# Patient Record
Sex: Male | Born: 1945 | Race: White | Hispanic: No | Marital: Married | Smoking: Former smoker
Health system: Southern US, Community
[De-identification: ages and names within clinical notes are randomized; demographics above are authoritative.]

## PROBLEM LIST (undated history)

## (undated) DIAGNOSIS — F329 Major depressive disorder, single episode, unspecified: Secondary | ICD-10-CM

## (undated) DIAGNOSIS — K635 Polyp of colon: Secondary | ICD-10-CM

## (undated) DIAGNOSIS — F1910 Other psychoactive substance abuse, uncomplicated: Secondary | ICD-10-CM

## (undated) DIAGNOSIS — E785 Hyperlipidemia, unspecified: Secondary | ICD-10-CM

## (undated) DIAGNOSIS — Z8639 Personal history of other endocrine, nutritional and metabolic disease: Secondary | ICD-10-CM

## (undated) DIAGNOSIS — I10 Essential (primary) hypertension: Secondary | ICD-10-CM

## (undated) DIAGNOSIS — K219 Gastro-esophageal reflux disease without esophagitis: Secondary | ICD-10-CM

## (undated) DIAGNOSIS — F32A Depression, unspecified: Secondary | ICD-10-CM

## (undated) DIAGNOSIS — L290 Pruritus ani: Secondary | ICD-10-CM

## (undated) DIAGNOSIS — K589 Irritable bowel syndrome without diarrhea: Secondary | ICD-10-CM

## (undated) HISTORY — DX: Other psychoactive substance abuse, uncomplicated: F19.10

## (undated) HISTORY — PX: NO PAST SURGERIES: SHX2092

## (undated) HISTORY — DX: Polyp of colon: K63.5

## (undated) HISTORY — DX: Depression, unspecified: F32.A

## (undated) HISTORY — DX: Pruritus ani: L29.0

## (undated) HISTORY — DX: Major depressive disorder, single episode, unspecified: F32.9

## (undated) HISTORY — DX: Gilbert syndrome: E80.4

## (undated) HISTORY — DX: Personal history of other endocrine, nutritional and metabolic disease: Z86.39

## (undated) HISTORY — DX: Gastro-esophageal reflux disease without esophagitis: K21.9

## (undated) HISTORY — DX: Irritable bowel syndrome, unspecified: K58.9

## (undated) HISTORY — DX: Hyperlipidemia, unspecified: E78.5

## (undated) HISTORY — DX: Essential (primary) hypertension: I10

---

## 1998-10-06 ENCOUNTER — Ambulatory Visit: Admission: RE | Admit: 1998-10-06 | Discharge: 1998-10-06 | Payer: Self-pay | Admitting: Orthopedic Surgery

## 2002-05-30 ENCOUNTER — Ambulatory Visit (HOSPITAL_COMMUNITY): Admission: RE | Admit: 2002-05-30 | Discharge: 2002-05-30 | Payer: Self-pay | Admitting: *Deleted

## 2002-05-30 ENCOUNTER — Encounter: Payer: Self-pay | Admitting: *Deleted

## 2002-07-02 ENCOUNTER — Emergency Department (HOSPITAL_COMMUNITY): Admission: EM | Admit: 2002-07-02 | Discharge: 2002-07-03 | Payer: Self-pay | Admitting: *Deleted

## 2004-06-12 ENCOUNTER — Ambulatory Visit (HOSPITAL_COMMUNITY): Admission: RE | Admit: 2004-06-12 | Discharge: 2004-06-12 | Payer: Self-pay | Admitting: Family Medicine

## 2008-05-10 ENCOUNTER — Encounter: Admission: RE | Admit: 2008-05-10 | Discharge: 2008-05-10 | Payer: Self-pay | Admitting: Family Medicine

## 2010-02-12 ENCOUNTER — Encounter
Admission: RE | Admit: 2010-02-12 | Discharge: 2010-02-12 | Payer: Self-pay | Source: Home / Self Care | Attending: Family Medicine | Admitting: Family Medicine

## 2010-12-19 ENCOUNTER — Emergency Department (HOSPITAL_COMMUNITY): Payer: Federal, State, Local not specified - PPO

## 2010-12-19 ENCOUNTER — Emergency Department (HOSPITAL_COMMUNITY)
Admission: EM | Admit: 2010-12-19 | Discharge: 2010-12-19 | Discharge status: Against Medical Advice | Payer: Federal, State, Local not specified - PPO | Attending: Emergency Medicine | Admitting: Emergency Medicine

## 2010-12-19 DIAGNOSIS — M549 Dorsalgia, unspecified: Secondary | ICD-10-CM | POA: Insufficient documentation

## 2010-12-19 DIAGNOSIS — R51 Headache: Secondary | ICD-10-CM | POA: Insufficient documentation

## 2010-12-19 DIAGNOSIS — H538 Other visual disturbances: Secondary | ICD-10-CM | POA: Insufficient documentation

## 2010-12-19 LAB — BASIC METABOLIC PANEL
BUN: 22 mg/dL (ref 6–23)
Calcium: 9.3 mg/dL (ref 8.4–10.5)
Chloride: 102 mEq/L (ref 96–112)
Creatinine, Ser: 0.73 mg/dL (ref 0.50–1.35)
GFR calc Af Amer: >90 mL/min (ref >90)
GFR calc non Af Amer: >90 mL/min (ref >90)

## 2010-12-19 LAB — URINALYSIS, ROUTINE W REFLEX MICROSCOPIC
Bilirubin Urine: NEGATIVE
Ketones, ur: NEGATIVE mg/dL
Nitrite: NEGATIVE
pH: 5.5 (ref 5.0–8.0)

## 2010-12-19 LAB — CBC
MCH: 31.8 pg (ref 26.0–34.0)
MCHC: 35.8 g/dL (ref 30.0–36.0)
Platelets: 217 10*3/uL (ref 150–400)
RDW: 12.4 % (ref 11.5–15.5)

## 2010-12-19 LAB — DIFFERENTIAL
Basophils Relative: 0 % (ref 0–1)
Eosinophils Absolute: 0.2 10*3/uL (ref 0.0–0.7)
Eosinophils Relative: 3 % (ref 0–5)
Monocytes Absolute: 0.8 10*3/uL (ref 0.1–1.0)
Monocytes Relative: 12 % (ref 3–12)

## 2010-12-19 LAB — PROTIME-INR: Prothrombin Time: 12.6 seconds (ref 11.6–15.2)

## 2012-02-08 ENCOUNTER — Other Ambulatory Visit: Payer: Self-pay | Admitting: Family Medicine

## 2012-02-08 DIAGNOSIS — M542 Cervicalgia: Secondary | ICD-10-CM

## 2012-02-15 ENCOUNTER — Ambulatory Visit
Admission: RE | Admit: 2012-02-15 | Discharge: 2012-02-15 | Disposition: A | Discharge status: Home / Self Care | Payer: Federal, State, Local not specified - PPO | Source: Ambulatory Visit | Attending: Family Medicine | Admitting: Family Medicine

## 2012-02-15 DIAGNOSIS — M542 Cervicalgia: Secondary | ICD-10-CM

## 2012-05-17 DIAGNOSIS — F331 Major depressive disorder, recurrent, moderate: Secondary | ICD-10-CM | POA: Diagnosis not present

## 2012-05-18 DIAGNOSIS — R21 Rash and other nonspecific skin eruption: Secondary | ICD-10-CM | POA: Diagnosis not present

## 2012-05-31 DIAGNOSIS — F331 Major depressive disorder, recurrent, moderate: Secondary | ICD-10-CM | POA: Diagnosis not present

## 2012-06-14 DIAGNOSIS — I10 Essential (primary) hypertension: Secondary | ICD-10-CM | POA: Diagnosis not present

## 2012-06-14 DIAGNOSIS — E291 Testicular hypofunction: Secondary | ICD-10-CM | POA: Diagnosis not present

## 2012-06-14 DIAGNOSIS — IMO0002 Reserved for concepts with insufficient information to code with codable children: Secondary | ICD-10-CM | POA: Diagnosis not present

## 2012-06-15 DIAGNOSIS — F331 Major depressive disorder, recurrent, moderate: Secondary | ICD-10-CM | POA: Diagnosis not present

## 2012-06-28 DIAGNOSIS — F331 Major depressive disorder, recurrent, moderate: Secondary | ICD-10-CM | POA: Diagnosis not present

## 2012-07-03 DIAGNOSIS — R109 Unspecified abdominal pain: Secondary | ICD-10-CM | POA: Diagnosis not present

## 2012-07-12 DIAGNOSIS — F331 Major depressive disorder, recurrent, moderate: Secondary | ICD-10-CM | POA: Diagnosis not present

## 2012-07-17 DIAGNOSIS — M7989 Other specified soft tissue disorders: Secondary | ICD-10-CM | POA: Diagnosis not present

## 2012-07-19 ENCOUNTER — Ambulatory Visit (HOSPITAL_COMMUNITY): Payer: Federal, State, Local not specified - PPO

## 2012-07-19 DIAGNOSIS — M7989 Other specified soft tissue disorders: Secondary | ICD-10-CM | POA: Diagnosis not present

## 2012-07-26 DIAGNOSIS — F331 Major depressive disorder, recurrent, moderate: Secondary | ICD-10-CM | POA: Diagnosis not present

## 2012-08-08 DIAGNOSIS — F331 Major depressive disorder, recurrent, moderate: Secondary | ICD-10-CM | POA: Diagnosis not present

## 2012-08-23 DIAGNOSIS — F331 Major depressive disorder, recurrent, moderate: Secondary | ICD-10-CM | POA: Diagnosis not present

## 2012-09-06 DIAGNOSIS — F331 Major depressive disorder, recurrent, moderate: Secondary | ICD-10-CM | POA: Diagnosis not present

## 2012-09-20 DIAGNOSIS — F331 Major depressive disorder, recurrent, moderate: Secondary | ICD-10-CM | POA: Diagnosis not present

## 2012-09-26 DIAGNOSIS — R079 Chest pain, unspecified: Secondary | ICD-10-CM | POA: Diagnosis not present

## 2012-09-26 DIAGNOSIS — M2550 Pain in unspecified joint: Secondary | ICD-10-CM | POA: Diagnosis not present

## 2012-09-26 DIAGNOSIS — E291 Testicular hypofunction: Secondary | ICD-10-CM | POA: Diagnosis not present

## 2012-09-26 DIAGNOSIS — Z1211 Encounter for screening for malignant neoplasm of colon: Secondary | ICD-10-CM | POA: Diagnosis not present

## 2012-09-26 DIAGNOSIS — Z0000 Encounter for general adult medical examination without abnormal findings: Secondary | ICD-10-CM | POA: Diagnosis not present

## 2012-09-26 DIAGNOSIS — Z125 Encounter for screening for malignant neoplasm of prostate: Secondary | ICD-10-CM | POA: Diagnosis not present

## 2012-09-26 DIAGNOSIS — Z1159 Encounter for screening for other viral diseases: Secondary | ICD-10-CM | POA: Diagnosis not present

## 2012-09-26 DIAGNOSIS — E785 Hyperlipidemia, unspecified: Secondary | ICD-10-CM | POA: Diagnosis not present

## 2012-09-26 DIAGNOSIS — Z23 Encounter for immunization: Secondary | ICD-10-CM | POA: Diagnosis not present

## 2012-09-26 DIAGNOSIS — I10 Essential (primary) hypertension: Secondary | ICD-10-CM | POA: Diagnosis not present

## 2012-10-04 DIAGNOSIS — F331 Major depressive disorder, recurrent, moderate: Secondary | ICD-10-CM | POA: Diagnosis not present

## 2012-10-11 DIAGNOSIS — E785 Hyperlipidemia, unspecified: Secondary | ICD-10-CM | POA: Diagnosis not present

## 2012-10-13 DIAGNOSIS — Z87891 Personal history of nicotine dependence: Secondary | ICD-10-CM | POA: Diagnosis not present

## 2012-10-18 DIAGNOSIS — F331 Major depressive disorder, recurrent, moderate: Secondary | ICD-10-CM | POA: Diagnosis not present

## 2012-11-08 DIAGNOSIS — F331 Major depressive disorder, recurrent, moderate: Secondary | ICD-10-CM | POA: Diagnosis not present

## 2012-11-22 DIAGNOSIS — F331 Major depressive disorder, recurrent, moderate: Secondary | ICD-10-CM | POA: Diagnosis not present

## 2012-11-29 DIAGNOSIS — Z23 Encounter for immunization: Secondary | ICD-10-CM | POA: Diagnosis not present

## 2012-12-13 DIAGNOSIS — F331 Major depressive disorder, recurrent, moderate: Secondary | ICD-10-CM | POA: Diagnosis not present

## 2012-12-27 DIAGNOSIS — F331 Major depressive disorder, recurrent, moderate: Secondary | ICD-10-CM | POA: Diagnosis not present

## 2013-01-10 DIAGNOSIS — F331 Major depressive disorder, recurrent, moderate: Secondary | ICD-10-CM | POA: Diagnosis not present

## 2013-01-10 DIAGNOSIS — I872 Venous insufficiency (chronic) (peripheral): Secondary | ICD-10-CM | POA: Diagnosis not present

## 2013-01-24 DIAGNOSIS — L821 Other seborrheic keratosis: Secondary | ICD-10-CM | POA: Diagnosis not present

## 2013-01-24 DIAGNOSIS — A630 Anogenital (venereal) warts: Secondary | ICD-10-CM | POA: Diagnosis not present

## 2013-01-24 DIAGNOSIS — D485 Neoplasm of uncertain behavior of skin: Secondary | ICD-10-CM | POA: Diagnosis not present

## 2013-01-24 DIAGNOSIS — L719 Rosacea, unspecified: Secondary | ICD-10-CM | POA: Diagnosis not present

## 2013-01-24 DIAGNOSIS — D239 Other benign neoplasm of skin, unspecified: Secondary | ICD-10-CM | POA: Diagnosis not present

## 2013-01-24 DIAGNOSIS — F331 Major depressive disorder, recurrent, moderate: Secondary | ICD-10-CM | POA: Diagnosis not present

## 2013-02-06 DIAGNOSIS — F331 Major depressive disorder, recurrent, moderate: Secondary | ICD-10-CM | POA: Diagnosis not present

## 2013-02-21 DIAGNOSIS — F331 Major depressive disorder, recurrent, moderate: Secondary | ICD-10-CM | POA: Diagnosis not present

## 2013-03-14 DIAGNOSIS — F331 Major depressive disorder, recurrent, moderate: Secondary | ICD-10-CM | POA: Diagnosis not present

## 2013-03-16 ENCOUNTER — Other Ambulatory Visit: Payer: Self-pay | Admitting: Family Medicine

## 2013-03-16 ENCOUNTER — Ambulatory Visit
Admission: RE | Admit: 2013-03-16 | Discharge: 2013-03-16 | Disposition: A | Discharge status: Home / Self Care | Payer: Medicare Other | Source: Ambulatory Visit | Attending: Family Medicine | Admitting: Family Medicine

## 2013-03-16 DIAGNOSIS — R109 Unspecified abdominal pain: Secondary | ICD-10-CM

## 2013-03-16 DIAGNOSIS — K5730 Diverticulosis of large intestine without perforation or abscess without bleeding: Secondary | ICD-10-CM | POA: Diagnosis not present

## 2013-03-16 DIAGNOSIS — J069 Acute upper respiratory infection, unspecified: Secondary | ICD-10-CM | POA: Diagnosis not present

## 2013-03-16 DIAGNOSIS — E291 Testicular hypofunction: Secondary | ICD-10-CM | POA: Diagnosis not present

## 2013-03-16 DIAGNOSIS — M549 Dorsalgia, unspecified: Secondary | ICD-10-CM | POA: Diagnosis not present

## 2013-03-16 MED ORDER — IOHEXOL 300 MG/ML  SOLN
100.0000 mL | Freq: Once | INTRAMUSCULAR | Status: AC | PRN
  Administered 2013-03-16: 100 mL via INTRAVENOUS

## 2013-03-16 MED ORDER — IOHEXOL 300 MG/ML  SOLN
30.0000 mL | Freq: Once | INTRAMUSCULAR | Status: AC | PRN
  Administered 2013-03-16: 30 mL via ORAL

## 2013-03-28 DIAGNOSIS — F331 Major depressive disorder, recurrent, moderate: Secondary | ICD-10-CM | POA: Diagnosis not present

## 2013-04-11 DIAGNOSIS — F331 Major depressive disorder, recurrent, moderate: Secondary | ICD-10-CM | POA: Diagnosis not present

## 2013-04-25 DIAGNOSIS — F331 Major depressive disorder, recurrent, moderate: Secondary | ICD-10-CM | POA: Diagnosis not present

## 2013-05-09 DIAGNOSIS — F331 Major depressive disorder, recurrent, moderate: Secondary | ICD-10-CM | POA: Diagnosis not present

## 2013-05-23 DIAGNOSIS — F331 Major depressive disorder, recurrent, moderate: Secondary | ICD-10-CM | POA: Diagnosis not present

## 2013-06-04 DIAGNOSIS — R5381 Other malaise: Secondary | ICD-10-CM | POA: Diagnosis not present

## 2013-06-04 DIAGNOSIS — I10 Essential (primary) hypertension: Secondary | ICD-10-CM | POA: Diagnosis not present

## 2013-06-04 DIAGNOSIS — R079 Chest pain, unspecified: Secondary | ICD-10-CM | POA: Diagnosis not present

## 2013-06-04 DIAGNOSIS — E291 Testicular hypofunction: Secondary | ICD-10-CM | POA: Diagnosis not present

## 2013-06-05 ENCOUNTER — Ambulatory Visit
Admission: RE | Admit: 2013-06-05 | Discharge: 2013-06-05 | Disposition: A | Discharge status: Home / Self Care | Payer: Medicare Other | Source: Ambulatory Visit | Attending: Family Medicine | Admitting: Family Medicine

## 2013-06-05 ENCOUNTER — Other Ambulatory Visit: Payer: Self-pay | Admitting: Family Medicine

## 2013-06-05 DIAGNOSIS — R079 Chest pain, unspecified: Secondary | ICD-10-CM

## 2013-06-06 DIAGNOSIS — F331 Major depressive disorder, recurrent, moderate: Secondary | ICD-10-CM | POA: Diagnosis not present

## 2013-06-20 DIAGNOSIS — F331 Major depressive disorder, recurrent, moderate: Secondary | ICD-10-CM | POA: Diagnosis not present

## 2013-07-03 ENCOUNTER — Encounter: Payer: Self-pay | Admitting: Cardiology

## 2013-07-03 ENCOUNTER — Ambulatory Visit (INDEPENDENT_AMBULATORY_CARE_PROVIDER_SITE_OTHER): Payer: Medicare Other | Admitting: Cardiology

## 2013-07-03 VITALS — BP 108/76 | HR 48 | Ht 71.0 in | Wt 206.0 lb

## 2013-07-03 DIAGNOSIS — I10 Essential (primary) hypertension: Secondary | ICD-10-CM | POA: Insufficient documentation

## 2013-07-03 DIAGNOSIS — R5383 Other fatigue: Secondary | ICD-10-CM | POA: Diagnosis not present

## 2013-07-03 DIAGNOSIS — R0600 Dyspnea, unspecified: Secondary | ICD-10-CM

## 2013-07-03 DIAGNOSIS — R5381 Other malaise: Secondary | ICD-10-CM

## 2013-07-03 DIAGNOSIS — E785 Hyperlipidemia, unspecified: Secondary | ICD-10-CM | POA: Diagnosis not present

## 2013-07-03 DIAGNOSIS — R0609 Other forms of dyspnea: Secondary | ICD-10-CM

## 2013-07-03 DIAGNOSIS — R0789 Other chest pain: Secondary | ICD-10-CM | POA: Insufficient documentation

## 2013-07-03 DIAGNOSIS — R0989 Other specified symptoms and signs involving the circulatory and respiratory systems: Secondary | ICD-10-CM | POA: Diagnosis not present

## 2013-07-03 DIAGNOSIS — E291 Testicular hypofunction: Secondary | ICD-10-CM | POA: Insufficient documentation

## 2013-07-03 NOTE — Patient Instructions (Signed)
Your physician has requested that you have en exercise stress myoview. For further information please visit [email or web address]. Please follow instruction sheet, as given.  Your physician recommends that you schedule a follow-up appointment with Dr. Meda Coffee after testing.

## 2013-07-03 NOTE — Progress Notes (Signed)
Patient ID: SHAMIR TUZZOLINO, male   DOB: [birth year], 68 y.o.   MRN: [id number]    Patient Name: Dustin Mckay Date of Encounter: 07/03/2013  Primary Care Provider:  No primary provider on file. Primary Cardiologist:  Dorothy Spark  Problem List   Past Medical History  Diagnosis Date  . Hypertension   . Hyperlipidemia   . H/O hypogonadism   . Esophageal reflux   . Gilbert's syndrome   . IBS (irritable bowel syndrome)   . Depression   . 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 Hyperplastic colon polyp   . Pruritus ani     Dr. Crista Luria in past consultation    Allergies  Allergies  Allergen Reactions  . Codeine Nausea And Vomiting  . Mobic [Meloxicam] Nausea Only  . Paxil [Paroxetine Hcl]     Empty-headed  . Zoloft [Sertraline Hcl]     Headache  . 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 The patient has no significant past medical history other than controlled hypertension and hypogonadism who is coming with concern of the peak exertional dyspnea associated with chest pressure. He usually used to go to the gym 3 times a week and was completely asymptomatic however recently he has been under significant amount of stress at work became more inactive andto be short of breath on activities but used to be no problem for him. He denies any palpitations or syncope. No lower extremity edema, paroxysmal nocturnal dyspnea or orthopnea. He doesn't have significant family history of coronary artery disease. He drinks alcohol occasionally and has never smoked. He states that he ran out off testosterone supplements recently and he believes that his lack of energy has been as a consequence of it. Since he restarted his energy has improved but is not back to baseline.   Home Medications  Prior to Admission medications   Medication Sig Start Date End Date Taking?  Authorizing Provider  ANDROGEL PUMP 20.25 MG/ACT (1.62%) GEL  06/13/13  Yes Historical Provider, MD  bisoprolol (ZEBETA) 5 MG tablet Take 2.5 mg by mouth daily.  06/12/13  Yes Historical Provider, MD  metroNIDAZOLE (METROGEL) 1 % gel Apply 1 application topically daily.  05/11/13  Yes Historical Provider, MD    Family History  Family History  Problem Relation Age of Onset  . Other Mother     respiratory problems   . Alcoholism Mother   . Gout Father   . Cirrhosis Father   . Hypertension Father   . Other Father     liposarcoma  . Pancreatic cancer Maternal Grandfather    Social History  History   Social History  . Marital Status: Married    Spouse Name: N/A    Number of Children: N/A  . Years of Education: N/A   Occupational History  . Not on file.   Social History Main Topics  . Smoking status: Former Research scientist (life sciences)  . Smokeless tobacco: Not on file  . Alcohol Use: Yes  . Drug Use: No  . Sexual Activity: Not on file   Other Topics Concern  . Not on file   Social History Narrative  . No narrative on file     Review of Systems, as per HPI, otherwise negative General:  No chills, fever, night sweats or weight changes.  Cardiovascular:  No chest pain, dyspnea on exertion, edema, orthopnea, palpitations,  paroxysmal nocturnal dyspnea. Dermatological: No rash, lesions/masses Respiratory: No cough, dyspnea Urologic: No hematuria, dysuria Abdominal:   No nausea, vomiting, diarrhea, bright red blood per rectum, melena, or hematemesis Neurologic:  No visual changes, wkns, changes in mental status. All other systems reviewed and are otherwise negative except as noted above.  Physical Exam  Blood pressure 108/76, pulse 48, height 5\' 11"  (1.803 m), weight 206 lb (93.441 kg).  General: Pleasant, NAD Psych: Normal affect. Neuro: Alert and oriented X 3. Moves all extremities spontaneously. HEENT: Normal  Neck: Supple without bruits or JVD. Lungs:  Resp regular and unlabored,  CTA. Heart: RRR no s3, s4, or murmurs. Abdomen: Soft, non-tender, non-distended, BS + x 4.  Extremities: No clubbing, cyanosis or edema. DP/PT/Radials 2+ and equal bilaterally.  Labs:  No results found for this basename: CKTOTAL, CKMB, TROPONINI,  in the last 72 hours Lab Results  Component Value Date   WBC 7.2 12/19/2010   HGB 14.7 12/19/2010   HCT 41.1 12/19/2010   MCV 89.0 12/19/2010   PLT 217 12/19/2010    No results found for this basename: DDIMER   No components found with this basename: POCBNP,     Component Value Date/Time   NA 137 12/19/2010 1230   K 4.0 12/19/2010 1230   CL 102 12/19/2010 1230   CO2 25 12/19/2010 1230   GLUCOSE 93 12/19/2010 1230   BUN 22 12/19/2010 1230   CREATININE 0.73 12/19/2010 1230   CALCIUM 9.3 12/19/2010 1230   GFRNONAA >90 12/19/2010 1230   GFRAA >90 12/19/2010 1230   No results found for this basename: CHOL, HDL, LDLCALC, TRIG    Accessory Clinical Findings  Echocardiogram - none  ECG - Sinus bradycardia, normal ECG    Assessment & Plan  68 year old male with h/o HTN  1. DOE with chest tightness and fatigue - we will order an exercise nuclear stress test   2. HTN  - controlled   3. Hyperlipidemia - LDL 128, HDL 45, TAG 163. There are ways to improve his diet. Also he is advised to use OTC fish oil and red yeast rice.   Follow up in 2 months  Dorothy Spark, MD, [facility] 07/03/2013, 8:34 AM

## 2013-07-04 DIAGNOSIS — F331 Major depressive disorder, recurrent, moderate: Secondary | ICD-10-CM | POA: Diagnosis not present

## 2013-07-17 ENCOUNTER — Ambulatory Visit (HOSPITAL_COMMUNITY): Payer: Medicare Other | Attending: Cardiovascular Disease | Admitting: Radiology

## 2013-07-17 VITALS — BP 127/87 | HR 41 | Ht 71.0 in | Wt 201.0 lb

## 2013-07-17 DIAGNOSIS — R079 Chest pain, unspecified: Secondary | ICD-10-CM | POA: Diagnosis not present

## 2013-07-17 DIAGNOSIS — R0789 Other chest pain: Secondary | ICD-10-CM

## 2013-07-17 DIAGNOSIS — R0602 Shortness of breath: Secondary | ICD-10-CM

## 2013-07-17 DIAGNOSIS — R5381 Other malaise: Secondary | ICD-10-CM | POA: Diagnosis not present

## 2013-07-17 DIAGNOSIS — R5383 Other fatigue: Secondary | ICD-10-CM

## 2013-07-17 MED ORDER — TECHNETIUM TC 99M SESTAMIBI GENERIC - CARDIOLITE
10.0000 | Freq: Once | INTRAVENOUS | Status: AC | PRN
  Administered 2013-07-17: 10 via INTRAVENOUS

## 2013-07-17 MED ORDER — TECHNETIUM TC 99M SESTAMIBI GENERIC - CARDIOLITE
30.0000 | Freq: Once | INTRAVENOUS | Status: AC | PRN
  Administered 2013-07-17: 30 via INTRAVENOUS

## 2013-07-17 NOTE — Progress Notes (Signed)
[place] [place] [address] [place], [place] [address] [phone]    Cardiology Nuclear Med Study  Dustin Mckay is a 68 y.o. male     MRN : [id number]     DOB: [birth year]  Procedure Date: 07/17/2013  Nuclear Med Background Indication for Stress Test:  Evaluation for Ischemia History: No prior known history of CAD, and 02-21-2004: Myocardial Perfusion Imaging-Normal, EF=64% Cardiac Risk Factors: History of Smoking, Hypertension and Lipids  Symptoms: Chest pain with/without exertion (last occurrence 1 to 2 weeks ago), Dizziness and DOE   Nuclear Pre-Procedure Caffeine/Decaff Intake:  None > 12 hrs NPO After: 7:30pm   Lungs:  clear O2 Sat: 95% on room air. IV 0.9% NS with Angio Cath:  22g  IV Site: R Hand x 1, tolerated well IV Started by:  Irven Baltimore, RN  Chest Size (in):  44L Cup Size: n/a  Height: 5\' 11"  (1.803 m)  Weight:  201 lb (91.173 kg)  BMI:  Body mass index is 28.05 kg/(m^2). Tech Comments:  Patient held Zebeta x 24 hrs. Irven Baltimore, RN    Nuclear Med Study 1 or 2 day study: 1 day  Stress Test Type:  Stress  Reading MD: N/A  Order Authorizing Provider:  Ena Dawley, MD  Resting Radionuclide: Technetium 83m Sestamibi  Resting Radionuclide Dose: 11.0 mCi   Stress Radionuclide:  Technetium 37m Sestamibi  Stress Radionuclide Dose: 33.0 mCi           Stress Protocol Rest HR: 41 Stress HR: 136  Rest BP: 127/87 Stress BP: 168/72  Exercise Time (min): 11:30 METS: 13.4   Predicted Max HR: 154 bpm % Max HR: 88.31 bpm Rate Pressure Product: 51884   Dose of Adenosine (mg):  n/a Dose of Lexiscan: n/a mg  Dose of Atropine (mg): n/a Dose of Dobutamine: n/a mcg/kg/min (at max HR)  Stress Test Technologist: Irven Baltimore, RN  Nuclear Technologist:  Vedia Pereyra, CNMT     Rest Procedure:  Myocardial perfusion imaging was performed at rest 45 minutes following the intravenous administration of Technetium 71m Sestamibi. Rest ECG:  Sinus bradycardia rate 41 with no other abnormalities.  Stress Procedure:  The patient exercised on the treadmill utilizing the Bruce Protocol for 11:30 minutes, RPE=17. The patient stopped due to DOE, Fatigue, and denied any chest pain. There was a blunted BP response to exercise, off Zebeta x 24 hrs. Technetium 43m Sestamibi was injected at peak exercise and myocardial perfusion imaging was performed after a brief delay. Stress ECG: During exercise, PACs noted, nonspecific ST-T wave changes.  QPS Raw Data Images:  Both arms are in down position. Stress Images:  There is subtle decreased uptake in the basal inferolateral wall. Otherwise, homogeneous radiotracer uptake. Rest Images:  There is decrease uptake in the inferolateral wall with bowel loop attenuation noted. No significant change from stress. Subtraction (SDS):  No evidence of ischemia. Transient Ischemic Dilatation (Normal <1.22):  **0.89* Lung/Heart Ratio (Normal <0.45):  0.31  Quantitative Gated Spect Images QGS EDV:  106 ml QGS ESV:  45 ml  Impression Exercise Capacity:  Excellent exercise capacity. BP Response:  Normal blood pressure response. Clinical Symptoms:  Mild dyspnea on exertion/fatigue. ECG Impression:  Nonspecific ST-T wave changes noted during stress. Comparison with Prior Nuclear Study: No images to compare  Overall Impression:  Low risk stress nuclear study With no areas of ischemia identified..  LV Ejection Fraction: 58%.  LV Wall Motion:  NL LV Function; NL Wall Motion,  PACs noted during stress.  Candee Furbish, MD

## 2013-07-18 ENCOUNTER — Encounter: Payer: Self-pay | Admitting: Cardiology

## 2013-07-18 ENCOUNTER — Ambulatory Visit (INDEPENDENT_AMBULATORY_CARE_PROVIDER_SITE_OTHER): Payer: Medicare Other | Admitting: Cardiology

## 2013-07-18 VITALS — BP 133/79 | HR 49 | Ht 71.0 in | Wt 205.0 lb

## 2013-07-18 DIAGNOSIS — R5383 Other fatigue: Secondary | ICD-10-CM | POA: Diagnosis not present

## 2013-07-18 DIAGNOSIS — I10 Essential (primary) hypertension: Secondary | ICD-10-CM | POA: Diagnosis not present

## 2013-07-18 DIAGNOSIS — R5381 Other malaise: Secondary | ICD-10-CM

## 2013-07-18 DIAGNOSIS — R0609 Other forms of dyspnea: Secondary | ICD-10-CM

## 2013-07-18 DIAGNOSIS — E291 Testicular hypofunction: Secondary | ICD-10-CM

## 2013-07-18 DIAGNOSIS — R0989 Other specified symptoms and signs involving the circulatory and respiratory systems: Secondary | ICD-10-CM

## 2013-07-18 DIAGNOSIS — F331 Major depressive disorder, recurrent, moderate: Secondary | ICD-10-CM | POA: Diagnosis not present

## 2013-07-18 DIAGNOSIS — R0789 Other chest pain: Secondary | ICD-10-CM

## 2013-07-18 NOTE — Progress Notes (Signed)
Patient ID: Dustin Mckay, male   DOB: [birth year], 68 y.o.   MRN: [id number] Patient ID: Dustin Mckay, male   DOB: 03-11-1945, 68 y.o.   MRN: [id number]    Patient Name: Dustin Mckay Date of Encounter: 07/18/2013  Primary Care Provider:  No primary provider on file. Primary Cardiologist:  Dorothy Spark  Problem List   Past Medical History  Diagnosis Date  . Hypertension   . Hyperlipidemia   . H/O hypogonadism   . Esophageal reflux   . Gilbert's syndrome   . IBS (irritable bowel syndrome)   . Depression   . 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 Hyperplastic colon polyp   . Pruritus ani     Dr. Crista Luria in past consultation    Allergies  Allergies  Allergen Reactions  . Codeine Nausea And Vomiting  . Mobic [Meloxicam] Nausea Only  . Paxil [Paroxetine Hcl]     Empty-headed  . Zoloft [Sertraline Hcl]     Headache  . 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 The patient has no significant past medical history other than controlled hypertension and hypogonadism who is coming with concern of the peak exertional dyspnea associated with chest pressure. He usually used to go to the gym 3 times a week and was completely asymptomatic however recently he has been under significant amount of stress at work became more inactive andto be short of breath on activities but used to be no problem for him. He denies any palpitations or syncope. No lower extremity edema, paroxysmal nocturnal dyspnea or orthopnea. He doesn't have significant family history of coronary artery disease. He drinks alcohol occasionally and has never smoked. He states that he ran out off testosterone supplements recently and he believes that his lack of energy has been as a consequence of it. Since he restarted his energy has improved but is not back to baseline.   The patient is coming  after 2 weeks for the test results. No more episodes of chest pain. He feels very stressed with his job at [employer] and doesn't sleep much.  Home Medications  Prior to Admission medications   Medication Sig Start Date End Date Taking? Authorizing Provider  ANDROGEL PUMP 20.25 MG/ACT (1.62%) GEL  06/13/13  Yes Historical Provider, MD  bisoprolol (ZEBETA) 5 MG tablet Take 2.5 mg by mouth daily.  06/12/13  Yes Historical Provider, MD  metroNIDAZOLE (METROGEL) 1 % gel Apply 1 application topically daily.  05/11/13  Yes Historical Provider, MD    Family History  Family History  Problem Relation Age of Onset  . Other Mother     respiratory problems   . Alcoholism Mother   . Gout Father   . Cirrhosis Father   . Hypertension Father   . Other Father     liposarcoma  . Pancreatic cancer Maternal Grandfather    Social History  History   Social History  . Marital Status: Married    Spouse Name: N/A    Number of Children: N/A  . Years of Education: N/A   Occupational History  . Not on file.   Social History Main Topics  . Smoking status: Former Research scientist (life sciences)  . Smokeless tobacco: Not on file  . Alcohol Use: Yes  . Drug Use: No  . Sexual Activity: Not on file   Other Topics Concern  .  Not on file   Social History Narrative  . No narrative on file     Review of Systems, as per HPI, otherwise negative General:  No chills, fever, night sweats or weight changes.  Cardiovascular:  No chest pain, dyspnea on exertion, edema, orthopnea, palpitations, paroxysmal nocturnal dyspnea. Dermatological: No rash, lesions/masses Respiratory: No cough, dyspnea Urologic: No hematuria, dysuria Abdominal:   No nausea, vomiting, diarrhea, bright red blood per rectum, melena, or hematemesis Neurologic:  No visual changes, wkns, changes in mental status. All other systems reviewed and are otherwise negative except as noted above.  Physical Exam  Blood pressure 133/79, pulse 49, height 5\' 11"  (1.803  m), weight 205 lb (92.987 kg).  General: Pleasant, NAD Psych: Normal affect. Neuro: Alert and oriented X 3. Moves all extremities spontaneously. HEENT: Normal  Neck: Supple without bruits or JVD. Lungs:  Resp regular and unlabored, CTA. Heart: RRR no s3, s4, or murmurs. Abdomen: Soft, non-tender, non-distended, BS + x 4.  Extremities: No clubbing, cyanosis or edema. DP/PT/Radials 2+ and equal bilaterally.  Labs:  No results found for this basename: CKTOTAL, CKMB, TROPONINI,  in the last 72 hours Lab Results  Component Value Date   WBC 7.2 12/19/2010   HGB 14.7 12/19/2010   HCT 41.1 12/19/2010   MCV 89.0 12/19/2010   PLT 217 12/19/2010    No results found for this basename: DDIMER   No components found with this basename: POCBNP,     Component Value Date/Time   NA 137 12/19/2010 1230   K 4.0 12/19/2010 1230   CL 102 12/19/2010 1230   CO2 25 12/19/2010 1230   GLUCOSE 93 12/19/2010 1230   BUN 22 12/19/2010 1230   CREATININE 0.73 12/19/2010 1230   CALCIUM 9.3 12/19/2010 1230   GFRNONAA >90 12/19/2010 1230   GFRAA >90 12/19/2010 1230   No results found for this basename: CHOL,  HDL,  LDLCALC,  TRIG    Accessory Clinical Findings  Echocardiogram - none  ECG - Sinus bradycardia, normal ECG    Assessment & Plan  68 year old male with h/o HTN  1. DOE with chest tightness and fatigue -  an exercise nuclear stress test showed excellent exercise capacity, 13 METS, no ECG changes, no scar or ischemia, LVEF 58%.  2. HTN  - controlled   3. Hyperlipidemia - LDL 128, HDL 45, TAG 163. There are ways to improve his diet. Also he is advised to use OTC fish oil and red yeast rice. Discussed the results again, diet in details, importance of exercise  Follow up in 4 months with repeat CMP and lipid profile prior to the visit.  Dorothy Spark, MD, [facility] 07/18/2013, 9:36 AM

## 2013-07-18 NOTE — Patient Instructions (Addendum)
Your physician recommends that you continue on your current medications as directed. Please refer to the Current Medication list given to you today.  Your physician recommends that you return for lab work in: IN September [place] ([facility] LIPID PROFILE) 11-06-13 IS APPOINTMENT  Your physician wants you to follow-up in: September WITH DR NELSON (COME IN FOR LAB WORK PRIOR TOO) You will receive a reminder letter in the mail two months in advance. If you don't receive a letter, please call our office to schedule the follow-up appointment.

## 2013-08-01 DIAGNOSIS — F331 Major depressive disorder, recurrent, moderate: Secondary | ICD-10-CM | POA: Diagnosis not present

## 2013-08-02 DIAGNOSIS — R300 Dysuria: Secondary | ICD-10-CM | POA: Diagnosis not present

## 2013-08-02 DIAGNOSIS — D126 Benign neoplasm of colon, unspecified: Secondary | ICD-10-CM | POA: Diagnosis not present

## 2013-08-15 DIAGNOSIS — F331 Major depressive disorder, recurrent, moderate: Secondary | ICD-10-CM | POA: Diagnosis not present

## 2013-08-29 DIAGNOSIS — F331 Major depressive disorder, recurrent, moderate: Secondary | ICD-10-CM | POA: Diagnosis not present

## 2013-09-11 DIAGNOSIS — A630 Anogenital (venereal) warts: Secondary | ICD-10-CM | POA: Diagnosis not present

## 2013-09-11 DIAGNOSIS — R300 Dysuria: Secondary | ICD-10-CM | POA: Diagnosis not present

## 2013-09-12 DIAGNOSIS — F331 Major depressive disorder, recurrent, moderate: Secondary | ICD-10-CM | POA: Diagnosis not present

## 2013-09-13 DIAGNOSIS — Z1211 Encounter for screening for malignant neoplasm of colon: Secondary | ICD-10-CM | POA: Diagnosis not present

## 2013-09-13 DIAGNOSIS — K5730 Diverticulosis of large intestine without perforation or abscess without bleeding: Secondary | ICD-10-CM | POA: Diagnosis not present

## 2013-10-10 DIAGNOSIS — F331 Major depressive disorder, recurrent, moderate: Secondary | ICD-10-CM | POA: Diagnosis not present

## 2013-10-19 DIAGNOSIS — E291 Testicular hypofunction: Secondary | ICD-10-CM | POA: Diagnosis not present

## 2013-10-19 DIAGNOSIS — Z0000 Encounter for general adult medical examination without abnormal findings: Secondary | ICD-10-CM | POA: Diagnosis not present

## 2013-10-19 DIAGNOSIS — I10 Essential (primary) hypertension: Secondary | ICD-10-CM | POA: Diagnosis not present

## 2013-10-19 DIAGNOSIS — E785 Hyperlipidemia, unspecified: Secondary | ICD-10-CM | POA: Diagnosis not present

## 2013-10-19 DIAGNOSIS — Z125 Encounter for screening for malignant neoplasm of prostate: Secondary | ICD-10-CM | POA: Diagnosis not present

## 2013-10-24 DIAGNOSIS — F331 Major depressive disorder, recurrent, moderate: Secondary | ICD-10-CM | POA: Diagnosis not present

## 2013-10-29 DIAGNOSIS — L259 Unspecified contact dermatitis, unspecified cause: Secondary | ICD-10-CM | POA: Diagnosis not present

## 2013-10-29 DIAGNOSIS — D239 Other benign neoplasm of skin, unspecified: Secondary | ICD-10-CM | POA: Diagnosis not present

## 2013-10-29 DIAGNOSIS — L821 Other seborrheic keratosis: Secondary | ICD-10-CM | POA: Diagnosis not present

## 2013-10-29 DIAGNOSIS — B353 Tinea pedis: Secondary | ICD-10-CM | POA: Diagnosis not present

## 2013-10-29 DIAGNOSIS — D1801 Hemangioma of skin and subcutaneous tissue: Secondary | ICD-10-CM | POA: Diagnosis not present

## 2013-10-29 DIAGNOSIS — L719 Rosacea, unspecified: Secondary | ICD-10-CM | POA: Diagnosis not present

## 2013-11-02 DIAGNOSIS — E785 Hyperlipidemia, unspecified: Secondary | ICD-10-CM | POA: Diagnosis not present

## 2013-11-06 ENCOUNTER — Other Ambulatory Visit: Payer: Medicare Other

## 2013-11-07 DIAGNOSIS — F331 Major depressive disorder, recurrent, moderate: Secondary | ICD-10-CM | POA: Diagnosis not present

## 2013-11-21 DIAGNOSIS — F331 Major depressive disorder, recurrent, moderate: Secondary | ICD-10-CM | POA: Diagnosis not present

## 2013-12-05 DIAGNOSIS — F331 Major depressive disorder, recurrent, moderate: Secondary | ICD-10-CM | POA: Diagnosis not present

## 2013-12-19 DIAGNOSIS — F331 Major depressive disorder, recurrent, moderate: Secondary | ICD-10-CM | POA: Diagnosis not present

## 2014-01-03 DIAGNOSIS — F331 Major depressive disorder, recurrent, moderate: Secondary | ICD-10-CM | POA: Diagnosis not present

## 2014-01-16 DIAGNOSIS — F331 Major depressive disorder, recurrent, moderate: Secondary | ICD-10-CM | POA: Diagnosis not present

## 2014-01-21 DIAGNOSIS — Z23 Encounter for immunization: Secondary | ICD-10-CM | POA: Diagnosis not present

## 2014-01-30 DIAGNOSIS — F331 Major depressive disorder, recurrent, moderate: Secondary | ICD-10-CM | POA: Diagnosis not present

## 2014-02-11 DIAGNOSIS — R0789 Other chest pain: Secondary | ICD-10-CM | POA: Diagnosis not present

## 2014-02-13 DIAGNOSIS — F331 Major depressive disorder, recurrent, moderate: Secondary | ICD-10-CM | POA: Diagnosis not present

## 2014-02-19 ENCOUNTER — Encounter: Payer: Self-pay | Admitting: Cardiology

## 2014-02-19 ENCOUNTER — Other Ambulatory Visit: Payer: Self-pay | Admitting: *Deleted

## 2014-02-19 ENCOUNTER — Ambulatory Visit (INDEPENDENT_AMBULATORY_CARE_PROVIDER_SITE_OTHER): Payer: Medicare Other | Admitting: Cardiology

## 2014-02-19 VITALS — BP 122/90 | HR 51 | Ht 71.0 in | Wt 211.0 lb

## 2014-02-19 DIAGNOSIS — R0789 Other chest pain: Secondary | ICD-10-CM

## 2014-02-19 DIAGNOSIS — Z8249 Family history of ischemic heart disease and other diseases of the circulatory system: Secondary | ICD-10-CM

## 2014-02-19 DIAGNOSIS — R079 Chest pain, unspecified: Secondary | ICD-10-CM | POA: Diagnosis not present

## 2014-02-19 DIAGNOSIS — E785 Hyperlipidemia, unspecified: Secondary | ICD-10-CM

## 2014-02-19 NOTE — Patient Instructions (Signed)
Your physician recommends that you continue on your current medications as directed. Please refer to the Current Medication list given to you today. Your physician recommends that you return for lab work in: March ([place] ---PLEASE BE FASTING) Your physician wants you to follow-up in: 4 months with Dr. Meda Coffee.  You will receive a reminder letter in the mail two months in advance. If you don't receive a letter, please call our office to schedule the follow-up appointment.  Please call to schedule cardiac CT score for in January. There is a $150 fee for this test.

## 2014-02-19 NOTE — Progress Notes (Signed)
Patient ID: Dustin Mckay, male   DOB: 1945/12/07, 68 y.o.   MRN: [id number]    Patient Name: Dustin Mckay Date of Encounter: 02/19/2014  Primary Care Provider:  No primary care provider on file. Primary Cardiologist:  Dorothy Spark  Problem List   Past Medical History  Diagnosis Date  . Hypertension   . Hyperlipidemia   . H/O hypogonadism   . Esophageal reflux   . Gilbert's syndrome   . IBS (irritable bowel syndrome)   . Depression   . 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 Hyperplastic colon polyp   . Pruritus ani     Dr. Crista Luria in past consultation    Allergies  Allergies  Allergen Reactions  . Codeine Nausea And Vomiting  . Mobic [Meloxicam] Nausea Only  . Paxil [Paroxetine Hcl]     Empty-headed  . Zoloft [Sertraline Hcl]     Headache  . 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 The patient has no significant past medical history other than controlled hypertension and hypogonadism who is coming with concern of the peak exertional dyspnea associated with chest pressure. He usually used to go to the gym 3 times a week and was completely asymptomatic however recently he has been under significant amount of stress at work became more inactive and to be short of breath on activities but used to be no problem for him. He denies any palpitations or syncope. No lower extremity edema, paroxysmal nocturnal dyspnea or orthopnea. He doesn't have significant family history of coronary artery disease. He drinks alcohol occasionally and has never smoked. He states that he ran out off testosterone supplements recently and he believes that his lack of energy has been as a consequence of it. Since he restarted his energy has improved but is not back to baseline.   The patient is coming after 2 weeks for the test results. No more episodes of chest pain. He  feels very stressed with his job at [employer] and doesn't sleep much.  02/19/2014 - the patient is coming after 6 months. In the meantime he was doing okay and trying to speak healthy diet and exercise. 2 weeks ago he went to the ER because he developed musculoskeletal pain in his chest. He feels no exertional pain when he goes to the gym but feel pains afterward that was relieved by Aleve. He hasn't been taking red yeast rice and is asking about [place] and other types of diet.  Home Medications  Prior to Admission medications   Medication Sig Start Date End Date Taking? Authorizing Provider  ANDROGEL PUMP 20.25 MG/ACT (1.62%) GEL  06/13/13  Yes Historical Provider, MD  bisoprolol (ZEBETA) 5 MG tablet Take 2.5 mg by mouth daily.  06/12/13  Yes Historical Provider, MD  metroNIDAZOLE (METROGEL) 1 % gel Apply 1 application topically daily.  05/11/13  Yes Historical Provider, MD    Family History  Family History  Problem Relation Age of Onset  . Other Mother     respiratory problems   . Alcoholism Mother   . Gout Father   . Cirrhosis Father   . Hypertension Father   . Other Father     liposarcoma  . Pancreatic cancer Maternal Grandfather    Social History  History   Social History  . Marital Status: Married    Spouse Name: N/A  Number of Children: N/A  . Years of Education: N/A   Occupational History  . Not on file.   Social History Main Topics  . Smoking status: Former Research scientist (life sciences)  . Smokeless tobacco: Not on file  . Alcohol Use: Yes  . Drug Use: No  . Sexual Activity: Not on file   Other Topics Concern  . Not on file   Social History Narrative     Review of Systems, as per HPI, otherwise negative General:  No chills, fever, night sweats or weight changes.  Cardiovascular:  No chest pain, dyspnea on exertion, edema, orthopnea, palpitations, paroxysmal nocturnal dyspnea. Dermatological: No rash, lesions/masses Respiratory: No cough, dyspnea Urologic: No  hematuria, dysuria Abdominal:   No nausea, vomiting, diarrhea, bright red blood per rectum, melena, or hematemesis Neurologic:  No visual changes, wkns, changes in mental status. All other systems reviewed and are otherwise negative except as noted above.  Physical Exam  Blood pressure 122/90, pulse 51, height 5\' 11"  (1.803 m), weight 211 lb (95.709 kg), SpO2 95 %.  General: Pleasant, NAD Psych: Normal affect. Neuro: Alert and oriented X 3. Moves all extremities spontaneously. HEENT: Normal  Neck: Supple without bruits or JVD. Lungs:  Resp regular and unlabored, CTA. Heart: RRR no s3, s4, or murmurs. Abdomen: Soft, non-tender, non-distended, BS + x 4.  Extremities: No clubbing, cyanosis or edema. DP/PT/Radials 2+ and equal bilaterally.  Labs:  No results for input(s): CKTOTAL, CKMB, TROPONINI in the last 72 hours. Lab Results  Component Value Date   WBC 7.2 12/19/2010   HGB 14.7 12/19/2010   HCT 41.1 12/19/2010   MCV 89.0 12/19/2010   PLT 217 12/19/2010    No results found for: DDIMER Invalid input(s): POCBNP    Component Value Date/Time   NA 137 12/19/2010 1230   K 4.0 12/19/2010 1230   CL 102 12/19/2010 1230   CO2 25 12/19/2010 1230   GLUCOSE 93 12/19/2010 1230   BUN 22 12/19/2010 1230   CREATININE 0.73 12/19/2010 1230   CALCIUM 9.3 12/19/2010 1230   GFRNONAA >90 12/19/2010 1230   GFRAA >90 12/19/2010 1230   No results found for: CHOL  Accessory Clinical Findings  Echocardiogram - none  ECG - Sinus bradycardia, normal ECG  Exercise nuclear stress test 07/2013  Impression Exercise Capacity: Excellent exercise capacity. BP Response: Normal blood pressure response. Clinical Symptoms: Mild dyspnea on exertion/fatigue. ECG Impression: Nonspecific ST-T wave changes noted during stress. Comparison with Prior Nuclear Study: No images to compare  Overall Impression: Low risk stress nuclear study With no areas of ischemia identified..  LV Ejection Fraction:  58%. LV Wall Motion: NL LV Function; NL Wall Motion, PACs noted during stress.  Assessment & Plan  68 year old male with h/o HTN  1. DOE with chest tightness and fatigue -  an exercise nuclear stress test showed excellent exercise capacity, 13 METS, no ECG changes, no scar or ischemia, LVEF 58%. Recent episode of muscle skeletal pain. No further workup needed at this point.   2. HTN  - controlled   3. Hyperlipidemia - LDL 128, HDL 45, TAG 163. There are ways to improve his diet. Also he is advised to use OTC fish oil and red yeast rice again. Discussed the results again, diet in details, importance of exercise, he is advised Mediterranean style diet and [employer]. We are also discussing Calcium score and that based on Most Recent Data Can Requalify Patient's for Statin Versus Not Statin Group. He Wants to Do  That Early January. He Wants Also Stick to a Strict Diet and Exercise Start Red Yeast Rice Income for Cholesterol and Compressive Metabolic Profile Checked in April.  Follow up in 4 months with repeat CMP and lipid profile prior to the visit.  Dorothy Spark, MD, [facility] 02/19/2014, 9:32 AM

## 2014-02-20 ENCOUNTER — Other Ambulatory Visit: Payer: Self-pay | Admitting: *Deleted

## 2014-02-20 DIAGNOSIS — Q245 Malformation of coronary vessels: Secondary | ICD-10-CM

## 2014-02-27 ENCOUNTER — Other Ambulatory Visit: Payer: Self-pay | Admitting: *Deleted

## 2014-02-27 DIAGNOSIS — E785 Hyperlipidemia, unspecified: Secondary | ICD-10-CM

## 2014-02-27 DIAGNOSIS — F331 Major depressive disorder, recurrent, moderate: Secondary | ICD-10-CM | POA: Diagnosis not present

## 2014-02-27 DIAGNOSIS — Z8249 Family history of ischemic heart disease and other diseases of the circulatory system: Secondary | ICD-10-CM

## 2014-02-27 DIAGNOSIS — R079 Chest pain, unspecified: Secondary | ICD-10-CM

## 2014-02-27 DIAGNOSIS — R0789 Other chest pain: Secondary | ICD-10-CM

## 2014-03-13 DIAGNOSIS — F331 Major depressive disorder, recurrent, moderate: Secondary | ICD-10-CM | POA: Diagnosis not present

## 2014-03-21 DIAGNOSIS — I10 Essential (primary) hypertension: Secondary | ICD-10-CM | POA: Diagnosis not present

## 2014-03-21 DIAGNOSIS — E291 Testicular hypofunction: Secondary | ICD-10-CM | POA: Diagnosis not present

## 2014-03-21 DIAGNOSIS — E785 Hyperlipidemia, unspecified: Secondary | ICD-10-CM | POA: Diagnosis not present

## 2014-03-21 DIAGNOSIS — F419 Anxiety disorder, unspecified: Secondary | ICD-10-CM | POA: Diagnosis not present

## 2014-03-21 DIAGNOSIS — L989 Disorder of the skin and subcutaneous tissue, unspecified: Secondary | ICD-10-CM | POA: Diagnosis not present

## 2014-03-21 DIAGNOSIS — J309 Allergic rhinitis, unspecified: Secondary | ICD-10-CM | POA: Diagnosis not present

## 2014-03-27 DIAGNOSIS — F331 Major depressive disorder, recurrent, moderate: Secondary | ICD-10-CM | POA: Diagnosis not present

## 2014-04-04 ENCOUNTER — Ambulatory Visit (INDEPENDENT_AMBULATORY_CARE_PROVIDER_SITE_OTHER)
Admission: RE | Admit: 2014-04-04 | Discharge: 2014-04-04 | Disposition: A | Discharge status: Home / Self Care | Payer: Medicare Other | Source: Ambulatory Visit | Attending: Cardiology | Admitting: Cardiology

## 2014-04-04 DIAGNOSIS — R079 Chest pain, unspecified: Secondary | ICD-10-CM

## 2014-04-04 DIAGNOSIS — Z8249 Family history of ischemic heart disease and other diseases of the circulatory system: Secondary | ICD-10-CM

## 2014-04-04 DIAGNOSIS — E785 Hyperlipidemia, unspecified: Secondary | ICD-10-CM

## 2014-04-10 DIAGNOSIS — F331 Major depressive disorder, recurrent, moderate: Secondary | ICD-10-CM | POA: Diagnosis not present

## 2014-04-24 DIAGNOSIS — F331 Major depressive disorder, recurrent, moderate: Secondary | ICD-10-CM | POA: Diagnosis not present

## 2014-05-08 DIAGNOSIS — F331 Major depressive disorder, recurrent, moderate: Secondary | ICD-10-CM | POA: Diagnosis not present

## 2014-05-10 ENCOUNTER — Other Ambulatory Visit: Payer: Medicare Other

## 2014-05-22 DIAGNOSIS — F331 Major depressive disorder, recurrent, moderate: Secondary | ICD-10-CM | POA: Diagnosis not present

## 2014-05-23 DIAGNOSIS — E291 Testicular hypofunction: Secondary | ICD-10-CM | POA: Diagnosis not present

## 2014-05-23 DIAGNOSIS — I10 Essential (primary) hypertension: Secondary | ICD-10-CM | POA: Diagnosis not present

## 2014-05-23 DIAGNOSIS — E785 Hyperlipidemia, unspecified: Secondary | ICD-10-CM | POA: Diagnosis not present

## 2014-06-05 DIAGNOSIS — F331 Major depressive disorder, recurrent, moderate: Secondary | ICD-10-CM | POA: Diagnosis not present

## 2014-06-17 DIAGNOSIS — L718 Other rosacea: Secondary | ICD-10-CM | POA: Diagnosis not present

## 2014-06-19 DIAGNOSIS — F331 Major depressive disorder, recurrent, moderate: Secondary | ICD-10-CM | POA: Diagnosis not present

## 2014-07-03 ENCOUNTER — Ambulatory Visit: Payer: Medicare Other | Admitting: Cardiology

## 2014-07-03 DIAGNOSIS — F331 Major depressive disorder, recurrent, moderate: Secondary | ICD-10-CM | POA: Diagnosis not present

## 2014-07-22 DIAGNOSIS — J309 Allergic rhinitis, unspecified: Secondary | ICD-10-CM | POA: Diagnosis not present

## 2014-07-24 DIAGNOSIS — F331 Major depressive disorder, recurrent, moderate: Secondary | ICD-10-CM | POA: Diagnosis not present

## 2014-08-07 DIAGNOSIS — F331 Major depressive disorder, recurrent, moderate: Secondary | ICD-10-CM | POA: Diagnosis not present

## 2014-08-12 ENCOUNTER — Ambulatory Visit
Admission: RE | Admit: 2014-08-12 | Discharge: 2014-08-12 | Disposition: A | Discharge status: Home / Self Care | Payer: Medicare Other | Source: Ambulatory Visit | Attending: Cardiology | Admitting: Cardiology

## 2014-08-12 ENCOUNTER — Ambulatory Visit (INDEPENDENT_AMBULATORY_CARE_PROVIDER_SITE_OTHER): Payer: Medicare Other | Admitting: Cardiology

## 2014-08-12 ENCOUNTER — Other Ambulatory Visit: Payer: Self-pay

## 2014-08-12 ENCOUNTER — Encounter: Payer: Self-pay | Admitting: Cardiology

## 2014-08-12 VITALS — BP 122/80 | HR 51 | Ht 71.0 in | Wt 205.0 lb

## 2014-08-12 DIAGNOSIS — E785 Hyperlipidemia, unspecified: Secondary | ICD-10-CM | POA: Diagnosis not present

## 2014-08-12 DIAGNOSIS — R05 Cough: Secondary | ICD-10-CM

## 2014-08-12 DIAGNOSIS — I10 Essential (primary) hypertension: Secondary | ICD-10-CM | POA: Diagnosis not present

## 2014-08-12 DIAGNOSIS — R053 Chronic cough: Secondary | ICD-10-CM

## 2014-08-12 DIAGNOSIS — J018 Other acute sinusitis: Secondary | ICD-10-CM

## 2014-08-12 DIAGNOSIS — J0100 Acute maxillary sinusitis, unspecified: Secondary | ICD-10-CM

## 2014-08-12 DIAGNOSIS — R0609 Other forms of dyspnea: Secondary | ICD-10-CM

## 2014-08-12 MED ORDER — AZITHROMYCIN 250 MG PO TABS: ORAL_TABLET | ORAL | Status: DC

## 2014-08-12 NOTE — Patient Instructions (Signed)
Medication Instructions:  Your physician recommends that you continue on your current medications as directed. Please refer to the Current Medication list given to you today.    Labwork: NONE  Testing/Procedures: A chest x-ray takes a picture of the organs and structures inside the chest, including the heart, lungs, and blood vessels. This test can show several things, including, whether the heart is enlarges; whether fluid is building up in the lungs; and whether pacemaker / defibrillator leads are still in place.  Follow-Up: Your physician wants you to follow-up in: 6 months with Dr. Meda Coffee.  You will receive a reminder letter in the mail two months in advance. If you don't receive a letter, please call our office to schedule the follow-up appointment.  Any Other Special Instructions Will Be Listed Below (If Applicable).

## 2014-08-12 NOTE — Progress Notes (Signed)
Patient ID: DEAARON FULGHUM, male   DOB: May 15, 1945, 69 y.o.   MRN: [id number]    Patient Name: Dustin Mckay Date of Encounter: 08/12/2014  Primary Care Provider:  No primary care provider on file. Primary Cardiologist:  Dorothy Spark  Problem List   Past Medical History  Diagnosis Date  . Hypertension   . Hyperlipidemia   . H/O hypogonadism   . Esophageal reflux   . Gilbert's syndrome   . IBS (irritable bowel syndrome)   . Depression   . 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 Hyperplastic colon polyp   . Pruritus ani     Dr. Crista Luria in past consultation    Allergies  Allergies  Allergen Reactions  . Codeine Nausea And Vomiting  . Mobic [Meloxicam] Nausea Only  . Paxil [Paroxetine Hcl]     Empty-headed  . Zoloft [Sertraline Hcl]     Headache  . 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 The patient has no significant past medical history other than controlled hypertension and hypogonadism who is coming with concern of the peak exertional dyspnea associated with chest pressure. He usually used to go to the gym 3 times a week and was completely asymptomatic however recently he has been under significant amount of stress at work became more inactive and to be short of breath on activities but used to be no problem for him. He denies any palpitations or syncope. No lower extremity edema, paroxysmal nocturnal dyspnea or orthopnea. He doesn't have significant family history of coronary artery disease. He drinks alcohol occasionally and has never smoked. He states that he ran out off testosterone supplements recently and he believes that his lack of energy has been as a consequence of it. Since he restarted his energy has improved but is not back to baseline.   The patient is coming after 2 weeks for the test  results. No more episodes of chest pain. He feels very stressed with his job at [employer] and doesn't sleep much.  02/19/2014 - the patient is coming after 6 months. In the meantime he was doing okay and trying to eat healthy diet and exercise. 2 weeks ago he went to the ER because he developed musculoskeletal pain in his chest. He feels no exertional pain when he goes to the gym but feel pains afterward that was relieved by Aleve. He hasn't been taking red yeast rice and is asking about [place] and other types of diet.  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 Trying to eat the best diet. He underwent Ca score in January and it was 0. He is complaint with his meds. Denies chest pain or SOB. No palpitations. He has developed significant cold on 07/26/14 with chronic cough with yellowish sputum, and now blood streaks in the nasal discharge. No fevers or chills, mild facial tenderness, no headaches.   Home Medications  Prior to Admission medications   Medication Sig Start Date End Date Taking? Authorizing Provider  ANDROGEL PUMP 20.25 MG/ACT (1.62%) GEL  06/13/13  Yes Historical Provider, MD  bisoprolol (ZEBETA) 5 MG tablet Take 2.5 mg by mouth daily.  06/12/13  Yes Historical Provider, MD  metroNIDAZOLE (METROGEL) 1 %  gel Apply 1 application topically daily.  05/11/13  Yes Historical Provider, MD    Family History  Family History  Problem Relation Age of Onset  . Other Mother     respiratory problems   . Alcoholism Mother   . Gout Father   . Cirrhosis Father   . Hypertension Father   . Other Father     liposarcoma  . Pancreatic cancer Maternal Grandfather    Social History  History   Social History  . Marital Status: Married    Spouse Name: N/A  . Number of Children: N/A  . Years of Education: N/A   Occupational History  . Not on file.   Social History Main Topics  .  Smoking status: Former Research scientist (life sciences)  . Smokeless tobacco: Not on file  . Alcohol Use: Yes  . Drug Use: No  . Sexual Activity: Not on file   Other Topics Concern  . Not on file   Social History Narrative     Review of Systems, as per HPI, otherwise negative General:  No chills, fever, night sweats or weight changes.  Cardiovascular:  No chest pain, dyspnea on exertion, edema, orthopnea, palpitations, paroxysmal nocturnal dyspnea. Dermatological: No rash, lesions/masses Respiratory: No cough, dyspnea Urologic: No hematuria, dysuria Abdominal:   No nausea, vomiting, diarrhea, bright red blood per rectum, melena, or hematemesis Neurologic:  No visual changes, wkns, changes in mental status. All other systems reviewed and are otherwise negative except as noted above.  Physical Exam  There were no vitals taken for this visit.  General: Pleasant, NAD Psych: Normal affect. Neuro: Alert and oriented X 3. Moves all extremities spontaneously. HEENT: Normal  Neck: Supple without bruits or JVD. Lungs:  Resp regular and unlabored, CTA. Heart: RRR no s3, s4, or murmurs. Abdomen: Soft, non-tender, non-distended, BS + x 4.  Extremities: No clubbing, cyanosis or edema. DP/PT/Radials 2+ and equal bilaterally.  Labs:  No results for input(s): CKTOTAL, CKMB, TROPONINI in the last 72 hours. Lab Results  Component Value Date   WBC 7.2 12/19/2010   HGB 14.7 12/19/2010   HCT 41.1 12/19/2010   MCV 89.0 12/19/2010   PLT 217 12/19/2010    No results found for: DDIMER Invalid input(s): POCBNP    Component Value Date/Time   NA 137 12/19/2010 1230   K 4.0 12/19/2010 1230   CL 102 12/19/2010 1230   CO2 25 12/19/2010 1230   GLUCOSE 93 12/19/2010 1230   BUN 22 12/19/2010 1230   CREATININE 0.73 12/19/2010 1230   CALCIUM 9.3 12/19/2010 1230   GFRNONAA >90 12/19/2010 1230   GFRAA >90 12/19/2010 1230   No results found for: CHOL  Accessory Clinical Findings  Echocardiogram - none  ECG - Sinus  bradycardia, normal ECG  Exercise nuclear stress test 07/2013  Impression Exercise Capacity: Excellent exercise capacity. BP Response: Normal blood pressure response. Clinical Symptoms: Mild dyspnea on exertion/fatigue. ECG Impression: Nonspecific ST-T wave changes noted during stress. Comparison with Prior Nuclear Study: No images to compare  Overall Impression: Low risk stress nuclear study With no areas of ischemia identified..  LV Ejection Fraction: 58%. LV Wall Motion: NL LV Function; NL Wall Motion, PACs noted during stress.  Calcium score: 0  Assessment & Plan  69 year old male with h/o HTN  1. DOE with chest tightness and fatigue -  an exercise nuclear stress test showed excellent exercise capacity, 13 METS, no ECG changes, no scar or ischemia, LVEF 58%. Recent episode of musculoskeletal pain. No further  workup needed at this point.   2. HTN  - controlled   3. Hyperlipidemia - LDL 128, HDL 45, TAG 163. There are ways to improve his diet. Also he is advised to use OTC fish oil and red yeast rice again. Discussed the results again, diet in details, importance of exercise, he is advised Mediterranean style diet and [employer]. Calcium score and that based on Most Recent Data Can Requalify Patient's for Statin Versus Not Statin Group - his was 0 and he doesn't need any statins right now. He Wants Also Stick to a Strict Diet and Exercise Start Red Yeast Rice Income for Cholesterol and Compressive Metabolic Profile Checked in April.  4. Bloody nasal discharge, facial tenderness, cough - we will check CXR, if normal we will treat for acute sinusitis with postnasal drip with Z pack  Follow up in 6 months.  Dorothy Spark, MD, [facility] 08/12/2014, 5:48 AM

## 2014-08-21 DIAGNOSIS — F331 Major depressive disorder, recurrent, moderate: Secondary | ICD-10-CM | POA: Diagnosis not present

## 2014-09-18 DIAGNOSIS — F331 Major depressive disorder, recurrent, moderate: Secondary | ICD-10-CM | POA: Diagnosis not present

## 2014-10-02 DIAGNOSIS — F331 Major depressive disorder, recurrent, moderate: Secondary | ICD-10-CM | POA: Diagnosis not present

## 2014-10-16 ENCOUNTER — Other Ambulatory Visit: Payer: Self-pay | Admitting: Family Medicine

## 2014-10-16 ENCOUNTER — Ambulatory Visit
Admission: RE | Admit: 2014-10-16 | Discharge: 2014-10-16 | Disposition: A | Discharge status: Home / Self Care | Payer: Medicare Other | Source: Ambulatory Visit | Attending: Family Medicine | Admitting: Family Medicine

## 2014-10-16 DIAGNOSIS — R0789 Other chest pain: Secondary | ICD-10-CM

## 2014-10-16 DIAGNOSIS — F331 Major depressive disorder, recurrent, moderate: Secondary | ICD-10-CM | POA: Diagnosis not present

## 2014-10-16 DIAGNOSIS — R001 Bradycardia, unspecified: Secondary | ICD-10-CM | POA: Diagnosis not present

## 2014-10-16 DIAGNOSIS — R079 Chest pain, unspecified: Secondary | ICD-10-CM | POA: Diagnosis not present

## 2014-10-16 DIAGNOSIS — R5383 Other fatigue: Secondary | ICD-10-CM | POA: Diagnosis not present

## 2014-10-23 DIAGNOSIS — I10 Essential (primary) hypertension: Secondary | ICD-10-CM | POA: Diagnosis not present

## 2014-10-30 DIAGNOSIS — B351 Tinea unguium: Secondary | ICD-10-CM | POA: Diagnosis not present

## 2014-10-30 DIAGNOSIS — F331 Major depressive disorder, recurrent, moderate: Secondary | ICD-10-CM | POA: Diagnosis not present

## 2014-10-30 DIAGNOSIS — Z23 Encounter for immunization: Secondary | ICD-10-CM | POA: Diagnosis not present

## 2014-10-30 DIAGNOSIS — E291 Testicular hypofunction: Secondary | ICD-10-CM | POA: Diagnosis not present

## 2014-10-30 DIAGNOSIS — Z125 Encounter for screening for malignant neoplasm of prostate: Secondary | ICD-10-CM | POA: Diagnosis not present

## 2014-10-30 DIAGNOSIS — E785 Hyperlipidemia, unspecified: Secondary | ICD-10-CM | POA: Diagnosis not present

## 2014-10-30 DIAGNOSIS — Z0000 Encounter for general adult medical examination without abnormal findings: Secondary | ICD-10-CM | POA: Diagnosis not present

## 2014-10-30 DIAGNOSIS — I10 Essential (primary) hypertension: Secondary | ICD-10-CM | POA: Diagnosis not present

## 2014-11-13 DIAGNOSIS — F331 Major depressive disorder, recurrent, moderate: Secondary | ICD-10-CM | POA: Diagnosis not present

## 2014-11-27 DIAGNOSIS — F331 Major depressive disorder, recurrent, moderate: Secondary | ICD-10-CM | POA: Diagnosis not present

## 2014-11-28 DIAGNOSIS — F331 Major depressive disorder, recurrent, moderate: Secondary | ICD-10-CM | POA: Diagnosis not present

## 2014-12-11 DIAGNOSIS — F331 Major depressive disorder, recurrent, moderate: Secondary | ICD-10-CM | POA: Diagnosis not present

## 2014-12-25 DIAGNOSIS — F331 Major depressive disorder, recurrent, moderate: Secondary | ICD-10-CM | POA: Diagnosis not present

## 2015-01-15 DIAGNOSIS — F331 Major depressive disorder, recurrent, moderate: Secondary | ICD-10-CM | POA: Diagnosis not present

## 2015-01-17 ENCOUNTER — Ambulatory Visit
Admission: RE | Admit: 2015-01-17 | Discharge: 2015-01-17 | Disposition: A | Discharge status: Home / Self Care | Payer: Medicare Other | Source: Ambulatory Visit | Attending: Family Medicine | Admitting: Family Medicine

## 2015-01-17 ENCOUNTER — Other Ambulatory Visit: Payer: Self-pay | Admitting: Family Medicine

## 2015-01-17 DIAGNOSIS — R05 Cough: Secondary | ICD-10-CM | POA: Diagnosis not present

## 2015-01-17 DIAGNOSIS — J40 Bronchitis, not specified as acute or chronic: Secondary | ICD-10-CM | POA: Diagnosis not present

## 2015-01-29 DIAGNOSIS — F331 Major depressive disorder, recurrent, moderate: Secondary | ICD-10-CM | POA: Diagnosis not present

## 2015-02-12 DIAGNOSIS — F331 Major depressive disorder, recurrent, moderate: Secondary | ICD-10-CM | POA: Diagnosis not present

## 2015-02-20 ENCOUNTER — Encounter: Payer: Self-pay | Admitting: Cardiology

## 2015-02-20 ENCOUNTER — Ambulatory Visit (INDEPENDENT_AMBULATORY_CARE_PROVIDER_SITE_OTHER): Payer: Medicare Other | Admitting: Cardiology

## 2015-02-20 VITALS — BP 110/70 | HR 56 | Ht 71.0 in | Wt 209.0 lb

## 2015-02-20 DIAGNOSIS — E785 Hyperlipidemia, unspecified: Secondary | ICD-10-CM

## 2015-02-20 DIAGNOSIS — R05 Cough: Secondary | ICD-10-CM

## 2015-02-20 DIAGNOSIS — R0609 Other forms of dyspnea: Secondary | ICD-10-CM

## 2015-02-20 DIAGNOSIS — R058 Other specified cough: Secondary | ICD-10-CM

## 2015-02-20 DIAGNOSIS — I10 Essential (primary) hypertension: Secondary | ICD-10-CM

## 2015-02-20 LAB — CBC WITH DIFFERENTIAL/PLATELET
Basophils Absolute: 0.1 10*3/uL (ref 0.0–0.1)
Basophils Relative: 1 % (ref 0–1)
Eosinophils Absolute: 0.2 10*3/uL (ref 0.0–0.7)
Eosinophils Relative: 4 % (ref 0–5)
HCT: 41.7 % (ref 39.0–52.0)
Hemoglobin: 14.7 g/dL (ref 13.0–17.0)
Lymphocytes Relative: 34 % (ref 12–46)
Lymphs Abs: 1.9 10*3/uL (ref 0.7–4.0)
MCH: 33 pg (ref 26.0–34.0)
MCHC: 35.3 g/dL (ref 30.0–36.0)
MCV: 93.5 fL (ref 78.0–100.0)
MPV: 9.5 fL (ref 8.6–12.4)
Monocytes Absolute: 0.7 10*3/uL (ref 0.1–1.0)
Monocytes Relative: 12 % (ref 3–12)
Neutro Abs: 2.7 10*3/uL (ref 1.7–7.7)
Neutrophils Relative %: 49 % (ref 43–77)
Platelets: 219 10*3/uL (ref 150–400)
RBC: 4.46 MIL/uL (ref 4.22–5.81)
RDW: 12.8 % (ref 11.5–15.5)
WBC: 5.5 10*3/uL (ref 4.0–10.5)

## 2015-02-20 LAB — COMPREHENSIVE METABOLIC PANEL
ALT: 34 U/L (ref 9–46)
AST: 27 U/L (ref 10–35)
Albumin: 4 g/dL (ref 3.6–5.1)
Alkaline Phosphatase: 37 U/L — ABNORMAL LOW (ref 40–115)
BUN: 20 mg/dL (ref 7–25)
CO2: 26 mmol/L (ref 20–31)
Calcium: 8.7 mg/dL (ref 8.6–10.3)
Chloride: 106 mmol/L (ref 98–110)
Creat: 0.93 mg/dL (ref 0.70–1.25)
Glucose, Bld: 84 mg/dL (ref 65–99)
Potassium: 4.1 mmol/L (ref 3.5–5.3)
Sodium: 141 mmol/L (ref 135–146)
Total Bilirubin: 1.2 mg/dL (ref 0.2–1.2)
Total Protein: 6.5 g/dL (ref 6.1–8.1)

## 2015-02-20 NOTE — Progress Notes (Signed)
Patient ID: HEBER BRADFIELD, male   DOB: 1945/08/28, 69 y.o.   MRN: [id number] Patient ID: ARGELIS FAVREAU, male   DOB: 05-Jan-1946, 69 y.o.   MRN: [id number]    Patient Name: RAANAN AUGUSTYNIAK Date of Encounter: 02/20/2015  Primary Care Provider:  No primary care provider on file. Primary Cardiologist:  Dorothy Spark  Problem List   Past Medical History  Diagnosis Date  . Hypertension   . Hyperlipidemia   . H/O hypogonadism   . Esophageal reflux   . Gilbert's syndrome   . IBS (irritable bowel syndrome)   . Depression   . 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 Hyperplastic colon polyp   . Pruritus ani     Dr. Crista Luria in past consultation    Allergies  Allergies  Allergen Reactions  . Codeine Nausea And Vomiting  . Mobic [Meloxicam] Nausea Only  . Paxil [Paroxetine Hcl]     Empty-headed  . Zoloft [Sertraline Hcl]     Headache  . 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 The patient has no significant past medical history other than controlled hypertension and hypogonadism who is coming with concern of the peak exertional dyspnea associated with chest pressure. He usually used to go to the gym 3 times a week and was completely asymptomatic however recently he has been under significant amount of stress at work became more inactive and to be short of breath on activities but used to be no problem for him. He denies any palpitations or syncope. No lower extremity edema, paroxysmal nocturnal dyspnea or orthopnea. He doesn't have significant family history of coronary artery disease. He drinks alcohol occasionally and has never smoked. He states that he ran out off testosterone supplements recently and he believes that his lack of energy has been as a consequence of it. Since he restarted his energy has improved  but is not back to baseline.   The patient is coming after 2 weeks for the test results. No more episodes of chest pain. He feels very stressed with his job at [employer] and doesn't sleep much.  02/19/2014 - the patient is coming after 6 months. In the meantime he was doing okay and trying to eat healthy diet and exercise. 2 weeks ago he went to the ER because he developed musculoskeletal pain in his chest. He feels no exertional pain when he goes to the gym but feel pains afterward that was relieved by Aleve. He hasn't been taking red yeast rice and is asking about [place] and other types of diet.  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 Trying to eat the best diet. He underwent Ca score in January and it was 0. He is complaint with his meds. Denies chest pain or SOB. No palpitations. He has developed significant cold on 07/26/14 with chronic cough with yellowish sputum, and now blood streaks in the nasal discharge. No fevers or chills, mild facial tenderness, no headaches.  02/20/2015 - 6 months follow up, he retired, not exercising, had productive cough and night sweats 2 weeks ago, treated with prednisone and ATB, improved, but now worsenedd. No CP, stable DOE. No LE edema, no palpitations or syncope.  Home Medications  Prior to Admission medications   Medication Sig Start Date End Date Taking? Authorizing Provider  ANDROGEL PUMP 20.25 MG/ACT (1.62%) GEL  06/13/13  Yes Historical Provider, MD  bisoprolol (ZEBETA) 5 MG tablet Take 2.5 mg by mouth daily.  06/12/13  Yes Historical Provider, MD  metroNIDAZOLE (METROGEL) 1 % gel Apply 1 application topically daily.  05/11/13  Yes Historical Provider, MD    Family History  Family History  Problem Relation Age of Onset  . Other Mother     respiratory problems   . Alcoholism Mother   . Gout Father   . Cirrhosis Father   . Hypertension  Father   . Other Father     liposarcoma  . Pancreatic cancer Maternal Grandfather    Social History  Social History   Social History  . Marital Status: Married    Spouse Name: N/A  . Number of Children: N/A  . Years of Education: N/A   Occupational History  . Not on file.   Social History Main Topics  . Smoking status: Former Research scientist (life sciences)  . Smokeless tobacco: Not on file  . Alcohol Use: Yes  . Drug Use: No  . Sexual Activity: Not on file   Other Topics Concern  . Not on file   Social History Narrative     Review of Systems, as per HPI, otherwise negative General:  No chills, fever, night sweats or weight changes.  Cardiovascular:  No chest pain, dyspnea on exertion, edema, orthopnea, palpitations, paroxysmal nocturnal dyspnea. Dermatological: No rash, lesions/masses Respiratory: No cough, dyspnea Urologic: No hematuria, dysuria Abdominal:   No nausea, vomiting, diarrhea, bright red blood per rectum, melena, or hematemesis Neurologic:  No visual changes, wkns, changes in mental status. All other systems reviewed and are otherwise negative except as noted above.  Physical Exam  Blood pressure 110/70, pulse 56, height 5\' 11"  (1.803 m), weight 209 lb (94.802 kg).  General: Pleasant, NAD Psych: Normal affect. Neuro: Alert and oriented X 3. Moves all extremities spontaneously. HEENT: Normal  Neck: Supple without bruits or JVD. Lungs:  Resp regular and unlabored, CTA. Heart: RRR no s3, s4, or murmurs. Abdomen: Soft, non-tender, non-distended, BS + x 4.  Extremities: No clubbing, cyanosis or edema. DP/PT/Radials 2+ and equal bilaterally.  Labs:  No results for input(s): CKTOTAL, CKMB, TROPONINI in the last 72 hours. Lab Results  Component Value Date   WBC 7.2 12/19/2010   HGB 14.7 12/19/2010   HCT 41.1 12/19/2010   MCV 89.0 12/19/2010   PLT 217 12/19/2010    No results found for: DDIMER Invalid input(s): POCBNP    Component Value Date/Time   NA 137 12/19/2010  1230   K 4.0 12/19/2010 1230   CL 102 12/19/2010 1230   CO2 25 12/19/2010 1230   GLUCOSE 93 12/19/2010 1230   BUN 22 12/19/2010 1230   CREATININE 0.73 12/19/2010 1230   CALCIUM 9.3 12/19/2010 1230   GFRNONAA >90 12/19/2010 1230   GFRAA >90 12/19/2010 1230   No results found for: CHOL  Accessory Clinical Findings  Echocardiogram - none  ECG - Sinus bradycardia, normal ECG  Exercise nuclear stress test 07/2013  Impression Exercise Capacity: Excellent exercise capacity. BP Response: Normal blood pressure response. Clinical Symptoms: Mild dyspnea on exertion/fatigue. ECG Impression: Nonspecific ST-T wave changes noted during stress. Comparison with Prior Nuclear Study: No images to compare  Overall Impression: Low risk stress nuclear study With no areas of ischemia identified..  LV Ejection Fraction: 58%. LV Wall  Motion: NL LV Function; NL Wall Motion, PACs noted during stress.  Calcium score: 0   Assessment & Plan  69 year old male with h/o HTN  1. DOE with chest tightness and fatigue -  an exercise nuclear stress test showed excellent exercise capacity, 13 METS, no ECG changes, no scar or ischemia, LVEF 58%. Recent episode of musculoskeletal pain. No further workup needed at this point.   2. HTN  - controlled   3. Hyperlipidemia - LDL 128, HDL 45, TAG 163. There are ways to improve his diet. Also he is advised to use OTC fish oil and red yeast rice again. Discussed the results again, diet in details, importance of exercise, he is advised Mediterranean style diet and [employer]. Calcium score and that based on Most Recent Data Can Requalify Patient's for Statin Versus Not Statin Group - his was 0 and he doesn't need any statins right now. He Wants Also Stick to a Strict Diet and Exercise Start Red Yeast Rice Income for Cholesterol and Compressive Metabolic Profile Checked in April. WE will check today, advised on proper exercise program, advised to join [employer]  sneakers program.  4. Productive cough, night sweats - we will check CBC, advised to use expectorants, mucinex and follow up with his PCP.  Follow up in 1 year.  Dorothy Spark, MD, [facility] 02/20/2015, 8:52 AM

## 2015-02-20 NOTE — Patient Instructions (Signed)
Medication Instructions:   Your physician recommends that you continue on your current medications as directed. Please refer to the Current Medication list given to you today.   Labwork:  TODAY---CMET, CBC W DIFF, AND NMR WITH LIPIDS    Follow-Up:  Your physician wants you to follow-up in: [place] will receive a reminder letter in the mail two months in advance. If you don't receive a letter, please call our office to schedule the follow-up appointment.    If you need a refill on your cardiac medications before your next appointment, please call your pharmacy.

## 2015-02-21 ENCOUNTER — Telehealth: Payer: Self-pay | Admitting: Cardiology

## 2015-02-21 NOTE — Telephone Encounter (Signed)
Notified the pt that per Dr Meda Coffee his labs showed normal CMP and normal CBC not suggestive of an acute infectious process such as pneumonia, and he should still follow with his PCP.  Informed the pt that his lipids are still pending, and we will contact him as soon as this is resulted.  Pt verbalized understanding.

## 2015-02-21 NOTE — Telephone Encounter (Signed)
New Message  Pt calling for blood work he states he is returning rn call

## 2015-02-24 LAB — CARDIO IQ(R) ADVANCED LIPID PANEL
Apolipoprotein B: 99 mg/dL (ref 52–109)
Cholesterol, Total: 192 mg/dL (ref 125–200)
Cholesterol/HDL Ratio: 3.8 calc (ref <=5.0)
HDL Cholesterol: 50 mg/dL (ref >=40)
LDL Large: 4250 nmol/L — ABNORMAL LOW (ref 4334–10815)
LDL Medium: 377 nmol/L (ref 167–465)
LDL Particle Number: 1476 nmol/L (ref 1016–2185)
LDL Peak Size: 217.4 Angstrom — ABNORMAL LOW (ref >=218.2)
LDL Small: 294 nmol/L (ref 123–441)
LDL, Calculated: 115 mg/dL
Lipoprotein (a): 58 nmol/L (ref <75)
Non-HDL Cholesterol: 142 mg/dL
Triglycerides: 134 mg/dL

## 2015-02-25 DIAGNOSIS — J069 Acute upper respiratory infection, unspecified: Secondary | ICD-10-CM | POA: Diagnosis not present

## 2015-02-26 DIAGNOSIS — F331 Major depressive disorder, recurrent, moderate: Secondary | ICD-10-CM | POA: Diagnosis not present

## 2015-03-12 DIAGNOSIS — F331 Major depressive disorder, recurrent, moderate: Secondary | ICD-10-CM | POA: Diagnosis not present

## 2015-03-19 DIAGNOSIS — D225 Melanocytic nevi of trunk: Secondary | ICD-10-CM | POA: Diagnosis not present

## 2015-03-19 DIAGNOSIS — D1801 Hemangioma of skin and subcutaneous tissue: Secondary | ICD-10-CM | POA: Diagnosis not present

## 2015-03-19 DIAGNOSIS — L814 Other melanin hyperpigmentation: Secondary | ICD-10-CM | POA: Diagnosis not present

## 2015-03-19 DIAGNOSIS — L718 Other rosacea: Secondary | ICD-10-CM | POA: Diagnosis not present

## 2015-03-19 DIAGNOSIS — D2261 Melanocytic nevi of right upper limb, including shoulder: Secondary | ICD-10-CM | POA: Diagnosis not present

## 2015-03-19 DIAGNOSIS — L821 Other seborrheic keratosis: Secondary | ICD-10-CM | POA: Diagnosis not present

## 2015-03-19 DIAGNOSIS — D2262 Melanocytic nevi of left upper limb, including shoulder: Secondary | ICD-10-CM | POA: Diagnosis not present

## 2015-03-19 DIAGNOSIS — D2271 Melanocytic nevi of right lower limb, including hip: Secondary | ICD-10-CM | POA: Diagnosis not present

## 2015-03-19 DIAGNOSIS — D2272 Melanocytic nevi of left lower limb, including hip: Secondary | ICD-10-CM | POA: Diagnosis not present

## 2015-04-02 DIAGNOSIS — F331 Major depressive disorder, recurrent, moderate: Secondary | ICD-10-CM | POA: Diagnosis not present

## 2015-04-16 DIAGNOSIS — F331 Major depressive disorder, recurrent, moderate: Secondary | ICD-10-CM | POA: Diagnosis not present

## 2015-04-30 DIAGNOSIS — F331 Major depressive disorder, recurrent, moderate: Secondary | ICD-10-CM | POA: Diagnosis not present

## 2015-05-02 DIAGNOSIS — E785 Hyperlipidemia, unspecified: Secondary | ICD-10-CM | POA: Diagnosis not present

## 2015-05-02 DIAGNOSIS — J392 Other diseases of pharynx: Secondary | ICD-10-CM | POA: Diagnosis not present

## 2015-05-02 DIAGNOSIS — F419 Anxiety disorder, unspecified: Secondary | ICD-10-CM | POA: Diagnosis not present

## 2015-05-02 DIAGNOSIS — E291 Testicular hypofunction: Secondary | ICD-10-CM | POA: Diagnosis not present

## 2015-05-02 DIAGNOSIS — I10 Essential (primary) hypertension: Secondary | ICD-10-CM | POA: Diagnosis not present

## 2015-05-21 DIAGNOSIS — F331 Major depressive disorder, recurrent, moderate: Secondary | ICD-10-CM | POA: Diagnosis not present

## 2015-05-23 DIAGNOSIS — S161XXA Strain of muscle, fascia and tendon at neck level, initial encounter: Secondary | ICD-10-CM | POA: Insufficient documentation

## 2015-06-04 DIAGNOSIS — F331 Major depressive disorder, recurrent, moderate: Secondary | ICD-10-CM | POA: Diagnosis not present

## 2015-06-18 DIAGNOSIS — F331 Major depressive disorder, recurrent, moderate: Secondary | ICD-10-CM | POA: Diagnosis not present

## 2015-06-23 DIAGNOSIS — M25551 Pain in right hip: Secondary | ICD-10-CM | POA: Diagnosis not present

## 2015-06-23 DIAGNOSIS — R262 Difficulty in walking, not elsewhere classified: Secondary | ICD-10-CM | POA: Diagnosis not present

## 2015-06-23 DIAGNOSIS — M545 Low back pain: Secondary | ICD-10-CM | POA: Diagnosis not present

## 2015-06-23 DIAGNOSIS — M6248 Contracture of muscle, other site: Secondary | ICD-10-CM | POA: Diagnosis not present

## 2015-06-26 DIAGNOSIS — M25551 Pain in right hip: Secondary | ICD-10-CM | POA: Diagnosis not present

## 2015-06-26 DIAGNOSIS — R262 Difficulty in walking, not elsewhere classified: Secondary | ICD-10-CM | POA: Diagnosis not present

## 2015-06-26 DIAGNOSIS — M545 Low back pain: Secondary | ICD-10-CM | POA: Diagnosis not present

## 2015-06-26 DIAGNOSIS — M6248 Contracture of muscle, other site: Secondary | ICD-10-CM | POA: Diagnosis not present

## 2015-07-09 DIAGNOSIS — F331 Major depressive disorder, recurrent, moderate: Secondary | ICD-10-CM | POA: Diagnosis not present

## 2015-07-11 DIAGNOSIS — R262 Difficulty in walking, not elsewhere classified: Secondary | ICD-10-CM | POA: Diagnosis not present

## 2015-07-11 DIAGNOSIS — M6248 Contracture of muscle, other site: Secondary | ICD-10-CM | POA: Diagnosis not present

## 2015-07-11 DIAGNOSIS — M545 Low back pain: Secondary | ICD-10-CM | POA: Diagnosis not present

## 2015-07-11 DIAGNOSIS — M25551 Pain in right hip: Secondary | ICD-10-CM | POA: Diagnosis not present

## 2015-07-16 DIAGNOSIS — M545 Low back pain: Secondary | ICD-10-CM | POA: Diagnosis not present

## 2015-07-16 DIAGNOSIS — M25551 Pain in right hip: Secondary | ICD-10-CM | POA: Diagnosis not present

## 2015-07-16 DIAGNOSIS — M6248 Contracture of muscle, other site: Secondary | ICD-10-CM | POA: Diagnosis not present

## 2015-07-16 DIAGNOSIS — R262 Difficulty in walking, not elsewhere classified: Secondary | ICD-10-CM | POA: Diagnosis not present

## 2015-07-21 DIAGNOSIS — M6248 Contracture of muscle, other site: Secondary | ICD-10-CM | POA: Diagnosis not present

## 2015-07-21 DIAGNOSIS — M545 Low back pain: Secondary | ICD-10-CM | POA: Diagnosis not present

## 2015-07-21 DIAGNOSIS — M25551 Pain in right hip: Secondary | ICD-10-CM | POA: Diagnosis not present

## 2015-07-21 DIAGNOSIS — R262 Difficulty in walking, not elsewhere classified: Secondary | ICD-10-CM | POA: Diagnosis not present

## 2015-07-23 DIAGNOSIS — F331 Major depressive disorder, recurrent, moderate: Secondary | ICD-10-CM | POA: Diagnosis not present

## 2015-07-29 DIAGNOSIS — M6248 Contracture of muscle, other site: Secondary | ICD-10-CM | POA: Diagnosis not present

## 2015-07-29 DIAGNOSIS — R262 Difficulty in walking, not elsewhere classified: Secondary | ICD-10-CM | POA: Diagnosis not present

## 2015-07-29 DIAGNOSIS — M25551 Pain in right hip: Secondary | ICD-10-CM | POA: Diagnosis not present

## 2015-07-29 DIAGNOSIS — M545 Low back pain: Secondary | ICD-10-CM | POA: Diagnosis not present

## 2015-07-31 DIAGNOSIS — M25551 Pain in right hip: Secondary | ICD-10-CM | POA: Diagnosis not present

## 2015-07-31 DIAGNOSIS — M545 Low back pain: Secondary | ICD-10-CM | POA: Diagnosis not present

## 2015-07-31 DIAGNOSIS — R262 Difficulty in walking, not elsewhere classified: Secondary | ICD-10-CM | POA: Diagnosis not present

## 2015-07-31 DIAGNOSIS — M6248 Contracture of muscle, other site: Secondary | ICD-10-CM | POA: Diagnosis not present

## 2015-08-06 DIAGNOSIS — F331 Major depressive disorder, recurrent, moderate: Secondary | ICD-10-CM | POA: Diagnosis not present

## 2015-08-07 DIAGNOSIS — S30810A Abrasion of lower back and pelvis, initial encounter: Secondary | ICD-10-CM | POA: Diagnosis not present

## 2015-08-07 DIAGNOSIS — Z23 Encounter for immunization: Secondary | ICD-10-CM | POA: Diagnosis not present

## 2015-08-07 DIAGNOSIS — S51011A Laceration without foreign body of right elbow, initial encounter: Secondary | ICD-10-CM | POA: Diagnosis not present

## 2015-08-09 DIAGNOSIS — S51011D Laceration without foreign body of right elbow, subsequent encounter: Secondary | ICD-10-CM | POA: Diagnosis not present

## 2015-08-11 DIAGNOSIS — R262 Difficulty in walking, not elsewhere classified: Secondary | ICD-10-CM | POA: Diagnosis not present

## 2015-08-11 DIAGNOSIS — M25551 Pain in right hip: Secondary | ICD-10-CM | POA: Diagnosis not present

## 2015-08-11 DIAGNOSIS — M6248 Contracture of muscle, other site: Secondary | ICD-10-CM | POA: Diagnosis not present

## 2015-08-11 DIAGNOSIS — M545 Low back pain: Secondary | ICD-10-CM | POA: Diagnosis not present

## 2015-08-14 DIAGNOSIS — R262 Difficulty in walking, not elsewhere classified: Secondary | ICD-10-CM | POA: Diagnosis not present

## 2015-08-14 DIAGNOSIS — M6248 Contracture of muscle, other site: Secondary | ICD-10-CM | POA: Diagnosis not present

## 2015-08-14 DIAGNOSIS — M545 Low back pain: Secondary | ICD-10-CM | POA: Diagnosis not present

## 2015-08-14 DIAGNOSIS — M25551 Pain in right hip: Secondary | ICD-10-CM | POA: Diagnosis not present

## 2015-08-18 DIAGNOSIS — Z4802 Encounter for removal of sutures: Secondary | ICD-10-CM | POA: Diagnosis not present

## 2015-08-18 DIAGNOSIS — R262 Difficulty in walking, not elsewhere classified: Secondary | ICD-10-CM | POA: Diagnosis not present

## 2015-08-18 DIAGNOSIS — M6248 Contracture of muscle, other site: Secondary | ICD-10-CM | POA: Diagnosis not present

## 2015-08-18 DIAGNOSIS — M545 Low back pain: Secondary | ICD-10-CM | POA: Diagnosis not present

## 2015-08-18 DIAGNOSIS — S5001XA Contusion of right elbow, initial encounter: Secondary | ICD-10-CM | POA: Diagnosis not present

## 2015-08-18 DIAGNOSIS — M25551 Pain in right hip: Secondary | ICD-10-CM | POA: Diagnosis not present

## 2015-08-20 DIAGNOSIS — F331 Major depressive disorder, recurrent, moderate: Secondary | ICD-10-CM | POA: Diagnosis not present

## 2015-08-21 DIAGNOSIS — M545 Low back pain: Secondary | ICD-10-CM | POA: Diagnosis not present

## 2015-08-21 DIAGNOSIS — M6248 Contracture of muscle, other site: Secondary | ICD-10-CM | POA: Diagnosis not present

## 2015-08-21 DIAGNOSIS — M25551 Pain in right hip: Secondary | ICD-10-CM | POA: Diagnosis not present

## 2015-08-21 DIAGNOSIS — R262 Difficulty in walking, not elsewhere classified: Secondary | ICD-10-CM | POA: Diagnosis not present

## 2015-08-22 DIAGNOSIS — M5416 Radiculopathy, lumbar region: Secondary | ICD-10-CM | POA: Diagnosis not present

## 2015-08-25 DIAGNOSIS — M6248 Contracture of muscle, other site: Secondary | ICD-10-CM | POA: Diagnosis not present

## 2015-08-25 DIAGNOSIS — M545 Low back pain: Secondary | ICD-10-CM | POA: Diagnosis not present

## 2015-08-25 DIAGNOSIS — R262 Difficulty in walking, not elsewhere classified: Secondary | ICD-10-CM | POA: Diagnosis not present

## 2015-08-25 DIAGNOSIS — M25551 Pain in right hip: Secondary | ICD-10-CM | POA: Diagnosis not present

## 2015-08-28 DIAGNOSIS — M545 Low back pain: Secondary | ICD-10-CM | POA: Diagnosis not present

## 2015-08-28 DIAGNOSIS — R262 Difficulty in walking, not elsewhere classified: Secondary | ICD-10-CM | POA: Diagnosis not present

## 2015-08-28 DIAGNOSIS — M6248 Contracture of muscle, other site: Secondary | ICD-10-CM | POA: Diagnosis not present

## 2015-08-28 DIAGNOSIS — M25551 Pain in right hip: Secondary | ICD-10-CM | POA: Diagnosis not present

## 2015-09-01 DIAGNOSIS — R262 Difficulty in walking, not elsewhere classified: Secondary | ICD-10-CM | POA: Diagnosis not present

## 2015-09-01 DIAGNOSIS — M25551 Pain in right hip: Secondary | ICD-10-CM | POA: Diagnosis not present

## 2015-09-01 DIAGNOSIS — M545 Low back pain: Secondary | ICD-10-CM | POA: Diagnosis not present

## 2015-09-01 DIAGNOSIS — M6248 Contracture of muscle, other site: Secondary | ICD-10-CM | POA: Diagnosis not present

## 2015-09-03 DIAGNOSIS — F331 Major depressive disorder, recurrent, moderate: Secondary | ICD-10-CM | POA: Diagnosis not present

## 2015-09-04 DIAGNOSIS — R262 Difficulty in walking, not elsewhere classified: Secondary | ICD-10-CM | POA: Diagnosis not present

## 2015-09-04 DIAGNOSIS — M25551 Pain in right hip: Secondary | ICD-10-CM | POA: Diagnosis not present

## 2015-09-04 DIAGNOSIS — M6248 Contracture of muscle, other site: Secondary | ICD-10-CM | POA: Diagnosis not present

## 2015-09-04 DIAGNOSIS — M545 Low back pain: Secondary | ICD-10-CM | POA: Diagnosis not present

## 2015-09-08 DIAGNOSIS — M6248 Contracture of muscle, other site: Secondary | ICD-10-CM | POA: Diagnosis not present

## 2015-09-08 DIAGNOSIS — M25551 Pain in right hip: Secondary | ICD-10-CM | POA: Diagnosis not present

## 2015-09-08 DIAGNOSIS — M545 Low back pain: Secondary | ICD-10-CM | POA: Diagnosis not present

## 2015-09-08 DIAGNOSIS — R262 Difficulty in walking, not elsewhere classified: Secondary | ICD-10-CM | POA: Diagnosis not present

## 2015-09-11 DIAGNOSIS — M25551 Pain in right hip: Secondary | ICD-10-CM | POA: Diagnosis not present

## 2015-09-11 DIAGNOSIS — M6248 Contracture of muscle, other site: Secondary | ICD-10-CM | POA: Diagnosis not present

## 2015-09-11 DIAGNOSIS — R262 Difficulty in walking, not elsewhere classified: Secondary | ICD-10-CM | POA: Diagnosis not present

## 2015-09-11 DIAGNOSIS — M545 Low back pain: Secondary | ICD-10-CM | POA: Diagnosis not present

## 2015-09-15 DIAGNOSIS — R262 Difficulty in walking, not elsewhere classified: Secondary | ICD-10-CM | POA: Diagnosis not present

## 2015-09-15 DIAGNOSIS — M25551 Pain in right hip: Secondary | ICD-10-CM | POA: Diagnosis not present

## 2015-09-15 DIAGNOSIS — M6248 Contracture of muscle, other site: Secondary | ICD-10-CM | POA: Diagnosis not present

## 2015-09-15 DIAGNOSIS — M545 Low back pain: Secondary | ICD-10-CM | POA: Diagnosis not present

## 2015-09-17 DIAGNOSIS — F331 Major depressive disorder, recurrent, moderate: Secondary | ICD-10-CM | POA: Diagnosis not present

## 2015-09-18 DIAGNOSIS — R262 Difficulty in walking, not elsewhere classified: Secondary | ICD-10-CM | POA: Diagnosis not present

## 2015-09-18 DIAGNOSIS — M6248 Contracture of muscle, other site: Secondary | ICD-10-CM | POA: Diagnosis not present

## 2015-09-18 DIAGNOSIS — M545 Low back pain: Secondary | ICD-10-CM | POA: Diagnosis not present

## 2015-09-18 DIAGNOSIS — M25551 Pain in right hip: Secondary | ICD-10-CM | POA: Diagnosis not present

## 2015-09-29 DIAGNOSIS — M545 Low back pain: Secondary | ICD-10-CM | POA: Diagnosis not present

## 2015-09-29 DIAGNOSIS — M6248 Contracture of muscle, other site: Secondary | ICD-10-CM | POA: Diagnosis not present

## 2015-09-29 DIAGNOSIS — M25551 Pain in right hip: Secondary | ICD-10-CM | POA: Diagnosis not present

## 2015-09-29 DIAGNOSIS — R262 Difficulty in walking, not elsewhere classified: Secondary | ICD-10-CM | POA: Diagnosis not present

## 2015-09-30 DIAGNOSIS — F331 Major depressive disorder, recurrent, moderate: Secondary | ICD-10-CM | POA: Diagnosis not present

## 2015-10-08 DIAGNOSIS — F331 Major depressive disorder, recurrent, moderate: Secondary | ICD-10-CM | POA: Diagnosis not present

## 2015-10-22 DIAGNOSIS — F331 Major depressive disorder, recurrent, moderate: Secondary | ICD-10-CM | POA: Diagnosis not present

## 2015-10-30 DIAGNOSIS — R040 Epistaxis: Secondary | ICD-10-CM | POA: Diagnosis not present

## 2015-10-30 DIAGNOSIS — Z0000 Encounter for general adult medical examination without abnormal findings: Secondary | ICD-10-CM | POA: Diagnosis not present

## 2015-10-30 DIAGNOSIS — E785 Hyperlipidemia, unspecified: Secondary | ICD-10-CM | POA: Diagnosis not present

## 2015-10-30 DIAGNOSIS — Z125 Encounter for screening for malignant neoplasm of prostate: Secondary | ICD-10-CM | POA: Diagnosis not present

## 2015-10-30 DIAGNOSIS — E291 Testicular hypofunction: Secondary | ICD-10-CM | POA: Diagnosis not present

## 2015-10-30 DIAGNOSIS — F419 Anxiety disorder, unspecified: Secondary | ICD-10-CM | POA: Diagnosis not present

## 2015-10-30 DIAGNOSIS — Z23 Encounter for immunization: Secondary | ICD-10-CM | POA: Diagnosis not present

## 2015-10-30 DIAGNOSIS — F411 Generalized anxiety disorder: Secondary | ICD-10-CM | POA: Diagnosis not present

## 2015-10-30 DIAGNOSIS — B351 Tinea unguium: Secondary | ICD-10-CM | POA: Diagnosis not present

## 2015-10-30 DIAGNOSIS — I10 Essential (primary) hypertension: Secondary | ICD-10-CM | POA: Diagnosis not present

## 2015-11-05 DIAGNOSIS — F331 Major depressive disorder, recurrent, moderate: Secondary | ICD-10-CM | POA: Diagnosis not present

## 2015-11-19 DIAGNOSIS — F331 Major depressive disorder, recurrent, moderate: Secondary | ICD-10-CM | POA: Diagnosis not present

## 2015-11-24 DIAGNOSIS — J208 Acute bronchitis due to other specified organisms: Secondary | ICD-10-CM | POA: Diagnosis not present

## 2015-11-24 DIAGNOSIS — B9689 Other specified bacterial agents as the cause of diseases classified elsewhere: Secondary | ICD-10-CM | POA: Diagnosis not present

## 2015-11-24 DIAGNOSIS — E785 Hyperlipidemia, unspecified: Secondary | ICD-10-CM | POA: Diagnosis not present

## 2015-12-24 DIAGNOSIS — F331 Major depressive disorder, recurrent, moderate: Secondary | ICD-10-CM | POA: Diagnosis not present

## 2016-01-07 DIAGNOSIS — F331 Major depressive disorder, recurrent, moderate: Secondary | ICD-10-CM | POA: Diagnosis not present

## 2016-01-16 DIAGNOSIS — R05 Cough: Secondary | ICD-10-CM | POA: Diagnosis not present

## 2016-01-21 DIAGNOSIS — F331 Major depressive disorder, recurrent, moderate: Secondary | ICD-10-CM | POA: Diagnosis not present

## 2016-02-04 DIAGNOSIS — F331 Major depressive disorder, recurrent, moderate: Secondary | ICD-10-CM | POA: Diagnosis not present

## 2016-02-18 DIAGNOSIS — F331 Major depressive disorder, recurrent, moderate: Secondary | ICD-10-CM | POA: Diagnosis not present

## 2016-02-25 DIAGNOSIS — F331 Major depressive disorder, recurrent, moderate: Secondary | ICD-10-CM | POA: Diagnosis not present

## 2016-03-18 DIAGNOSIS — F331 Major depressive disorder, recurrent, moderate: Secondary | ICD-10-CM | POA: Diagnosis not present

## 2016-03-26 ENCOUNTER — Other Ambulatory Visit: Payer: Self-pay | Admitting: Family Medicine

## 2016-03-26 ENCOUNTER — Ambulatory Visit
Admission: RE | Admit: 2016-03-26 | Discharge: 2016-03-26 | Disposition: A | Discharge status: Home / Self Care | Payer: Medicare Other | Source: Ambulatory Visit | Attending: Family Medicine | Admitting: Family Medicine

## 2016-03-26 DIAGNOSIS — R05 Cough: Secondary | ICD-10-CM

## 2016-03-26 DIAGNOSIS — R059 Cough, unspecified: Secondary | ICD-10-CM

## 2016-03-26 DIAGNOSIS — R079 Chest pain, unspecified: Secondary | ICD-10-CM | POA: Diagnosis not present

## 2016-03-26 DIAGNOSIS — J40 Bronchitis, not specified as acute or chronic: Secondary | ICD-10-CM | POA: Diagnosis not present

## 2016-03-31 DIAGNOSIS — F331 Major depressive disorder, recurrent, moderate: Secondary | ICD-10-CM | POA: Diagnosis not present

## 2016-04-01 DIAGNOSIS — L821 Other seborrheic keratosis: Secondary | ICD-10-CM | POA: Diagnosis not present

## 2016-04-01 DIAGNOSIS — D2262 Melanocytic nevi of left upper limb, including shoulder: Secondary | ICD-10-CM | POA: Diagnosis not present

## 2016-04-01 DIAGNOSIS — D2272 Melanocytic nevi of left lower limb, including hip: Secondary | ICD-10-CM | POA: Diagnosis not present

## 2016-04-01 DIAGNOSIS — D2261 Melanocytic nevi of right upper limb, including shoulder: Secondary | ICD-10-CM | POA: Diagnosis not present

## 2016-04-01 DIAGNOSIS — L308 Other specified dermatitis: Secondary | ICD-10-CM | POA: Diagnosis not present

## 2016-04-01 DIAGNOSIS — D224 Melanocytic nevi of scalp and neck: Secondary | ICD-10-CM | POA: Diagnosis not present

## 2016-04-01 DIAGNOSIS — D225 Melanocytic nevi of trunk: Secondary | ICD-10-CM | POA: Diagnosis not present

## 2016-04-01 DIAGNOSIS — L718 Other rosacea: Secondary | ICD-10-CM | POA: Diagnosis not present

## 2016-04-14 DIAGNOSIS — F331 Major depressive disorder, recurrent, moderate: Secondary | ICD-10-CM | POA: Diagnosis not present

## 2016-05-26 DIAGNOSIS — F331 Major depressive disorder, recurrent, moderate: Secondary | ICD-10-CM | POA: Diagnosis not present

## 2016-06-09 DIAGNOSIS — F331 Major depressive disorder, recurrent, moderate: Secondary | ICD-10-CM | POA: Diagnosis not present

## 2016-06-23 DIAGNOSIS — F331 Major depressive disorder, recurrent, moderate: Secondary | ICD-10-CM | POA: Diagnosis not present

## 2016-07-07 DIAGNOSIS — F331 Major depressive disorder, recurrent, moderate: Secondary | ICD-10-CM | POA: Diagnosis not present

## 2016-07-09 DIAGNOSIS — R369 Urethral discharge, unspecified: Secondary | ICD-10-CM | POA: Diagnosis not present

## 2016-07-21 DIAGNOSIS — F331 Major depressive disorder, recurrent, moderate: Secondary | ICD-10-CM | POA: Diagnosis not present

## 2016-08-03 DIAGNOSIS — F331 Major depressive disorder, recurrent, moderate: Secondary | ICD-10-CM | POA: Diagnosis not present

## 2016-08-18 DIAGNOSIS — F331 Major depressive disorder, recurrent, moderate: Secondary | ICD-10-CM | POA: Diagnosis not present

## 2016-08-30 ENCOUNTER — Other Ambulatory Visit: Payer: Self-pay | Admitting: Family Medicine

## 2016-08-30 ENCOUNTER — Ambulatory Visit
Admission: RE | Admit: 2016-08-30 | Discharge: 2016-08-30 | Disposition: A | Discharge status: Home / Self Care | Payer: Medicare Other | Source: Ambulatory Visit | Attending: Family Medicine | Admitting: Family Medicine

## 2016-08-30 DIAGNOSIS — R0781 Pleurodynia: Secondary | ICD-10-CM | POA: Diagnosis not present

## 2016-08-30 DIAGNOSIS — R0789 Other chest pain: Secondary | ICD-10-CM

## 2016-08-30 DIAGNOSIS — M549 Dorsalgia, unspecified: Secondary | ICD-10-CM | POA: Diagnosis not present

## 2016-09-01 DIAGNOSIS — F331 Major depressive disorder, recurrent, moderate: Secondary | ICD-10-CM | POA: Diagnosis not present

## 2016-09-15 DIAGNOSIS — F331 Major depressive disorder, recurrent, moderate: Secondary | ICD-10-CM | POA: Diagnosis not present

## 2016-09-29 DIAGNOSIS — F331 Major depressive disorder, recurrent, moderate: Secondary | ICD-10-CM | POA: Diagnosis not present

## 2016-10-13 DIAGNOSIS — F331 Major depressive disorder, recurrent, moderate: Secondary | ICD-10-CM | POA: Diagnosis not present

## 2016-10-27 DIAGNOSIS — F331 Major depressive disorder, recurrent, moderate: Secondary | ICD-10-CM | POA: Diagnosis not present

## 2016-11-24 DIAGNOSIS — F331 Major depressive disorder, recurrent, moderate: Secondary | ICD-10-CM | POA: Diagnosis not present

## 2016-12-08 DIAGNOSIS — F331 Major depressive disorder, recurrent, moderate: Secondary | ICD-10-CM | POA: Diagnosis not present

## 2017-01-12 DIAGNOSIS — F331 Major depressive disorder, recurrent, moderate: Secondary | ICD-10-CM | POA: Diagnosis not present

## 2017-01-19 DIAGNOSIS — F331 Major depressive disorder, recurrent, moderate: Secondary | ICD-10-CM | POA: Diagnosis not present

## 2017-02-02 DIAGNOSIS — F331 Major depressive disorder, recurrent, moderate: Secondary | ICD-10-CM | POA: Diagnosis not present

## 2017-02-07 DIAGNOSIS — K219 Gastro-esophageal reflux disease without esophagitis: Secondary | ICD-10-CM | POA: Diagnosis not present

## 2017-02-07 DIAGNOSIS — Z125 Encounter for screening for malignant neoplasm of prostate: Secondary | ICD-10-CM | POA: Diagnosis not present

## 2017-02-07 DIAGNOSIS — E785 Hyperlipidemia, unspecified: Secondary | ICD-10-CM | POA: Diagnosis not present

## 2017-02-07 DIAGNOSIS — E291 Testicular hypofunction: Secondary | ICD-10-CM | POA: Diagnosis not present

## 2017-02-07 DIAGNOSIS — I10 Essential (primary) hypertension: Secondary | ICD-10-CM | POA: Diagnosis not present

## 2017-02-07 DIAGNOSIS — Z23 Encounter for immunization: Secondary | ICD-10-CM | POA: Diagnosis not present

## 2017-02-07 DIAGNOSIS — F419 Anxiety disorder, unspecified: Secondary | ICD-10-CM | POA: Diagnosis not present

## 2017-02-07 DIAGNOSIS — Z0000 Encounter for general adult medical examination without abnormal findings: Secondary | ICD-10-CM | POA: Diagnosis not present

## 2017-02-16 DIAGNOSIS — F331 Major depressive disorder, recurrent, moderate: Secondary | ICD-10-CM | POA: Diagnosis not present

## 2017-03-16 DIAGNOSIS — F331 Major depressive disorder, recurrent, moderate: Secondary | ICD-10-CM | POA: Diagnosis not present

## 2017-03-30 DIAGNOSIS — F331 Major depressive disorder, recurrent, moderate: Secondary | ICD-10-CM | POA: Diagnosis not present

## 2017-04-01 DIAGNOSIS — D2272 Melanocytic nevi of left lower limb, including hip: Secondary | ICD-10-CM | POA: Diagnosis not present

## 2017-04-01 DIAGNOSIS — D224 Melanocytic nevi of scalp and neck: Secondary | ICD-10-CM | POA: Diagnosis not present

## 2017-04-01 DIAGNOSIS — L718 Other rosacea: Secondary | ICD-10-CM | POA: Diagnosis not present

## 2017-04-01 DIAGNOSIS — D2261 Melanocytic nevi of right upper limb, including shoulder: Secondary | ICD-10-CM | POA: Diagnosis not present

## 2017-04-01 DIAGNOSIS — L821 Other seborrheic keratosis: Secondary | ICD-10-CM | POA: Diagnosis not present

## 2017-04-01 DIAGNOSIS — D1801 Hemangioma of skin and subcutaneous tissue: Secondary | ICD-10-CM | POA: Diagnosis not present

## 2017-04-01 DIAGNOSIS — D2262 Melanocytic nevi of left upper limb, including shoulder: Secondary | ICD-10-CM | POA: Diagnosis not present

## 2017-04-01 DIAGNOSIS — D225 Melanocytic nevi of trunk: Secondary | ICD-10-CM | POA: Diagnosis not present

## 2017-04-13 DIAGNOSIS — F331 Major depressive disorder, recurrent, moderate: Secondary | ICD-10-CM | POA: Diagnosis not present

## 2017-04-21 DIAGNOSIS — K219 Gastro-esophageal reflux disease without esophagitis: Secondary | ICD-10-CM | POA: Diagnosis not present

## 2017-04-27 DIAGNOSIS — F331 Major depressive disorder, recurrent, moderate: Secondary | ICD-10-CM | POA: Diagnosis not present

## 2017-05-11 DIAGNOSIS — F331 Major depressive disorder, recurrent, moderate: Secondary | ICD-10-CM | POA: Diagnosis not present

## 2017-05-18 DIAGNOSIS — H04123 Dry eye syndrome of bilateral lacrimal glands: Secondary | ICD-10-CM | POA: Diagnosis not present

## 2017-05-18 DIAGNOSIS — H2511 Age-related nuclear cataract, right eye: Secondary | ICD-10-CM | POA: Diagnosis not present

## 2017-05-18 DIAGNOSIS — H25812 Combined forms of age-related cataract, left eye: Secondary | ICD-10-CM | POA: Diagnosis not present

## 2017-05-25 DIAGNOSIS — F331 Major depressive disorder, recurrent, moderate: Secondary | ICD-10-CM | POA: Diagnosis not present

## 2017-06-27 IMAGING — CR DG CHEST 2V
2 series · 2 of 2 positions shown · non-contrast
Comparison: 10/16/2014

CLINICAL DATA: Bronchitis. Anterior chest discomfort for 3 weeks.
Cough and chest congestion for 3 days. Former smoker.

EXAM:
CHEST  2 VIEW

[w chest pa]
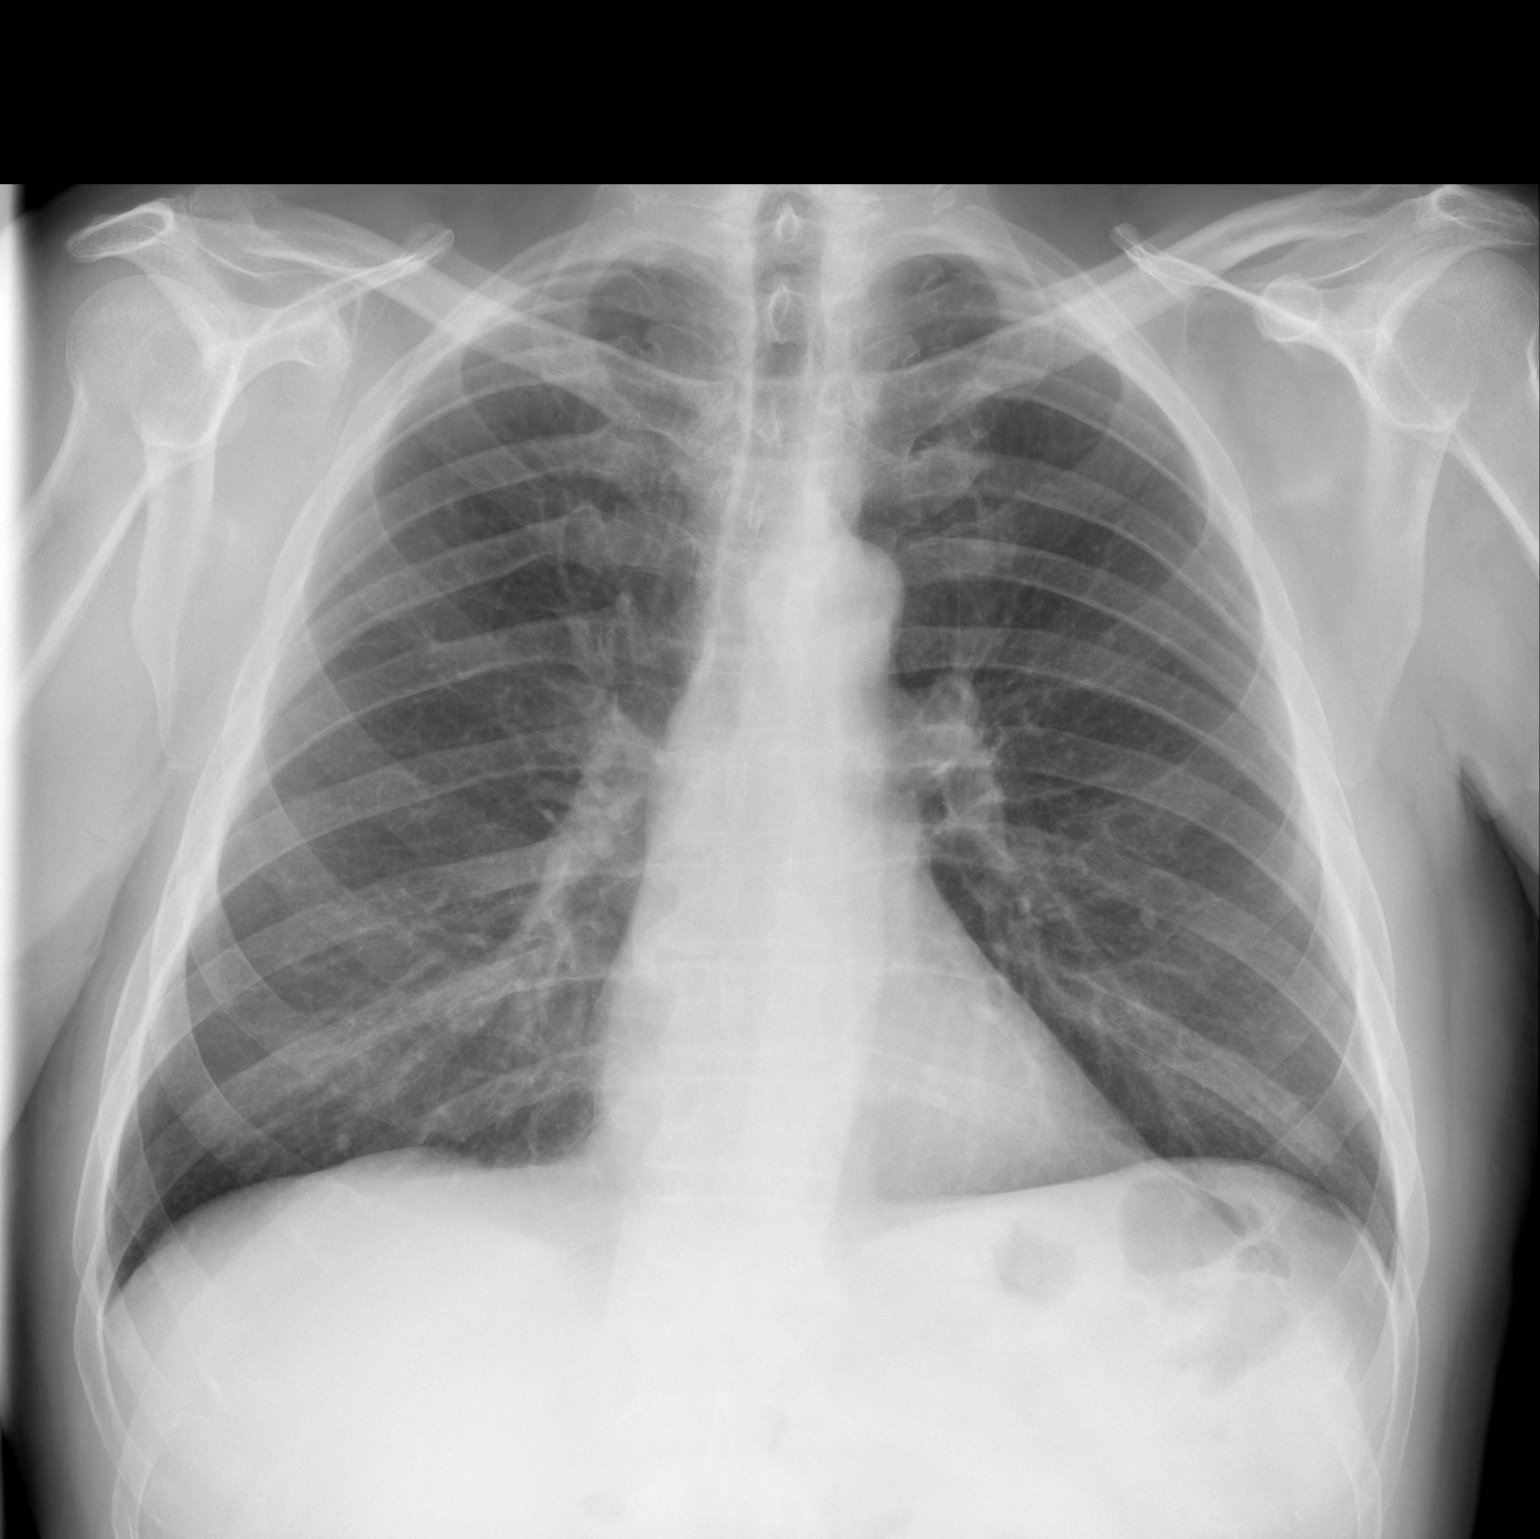

[w chest lat]
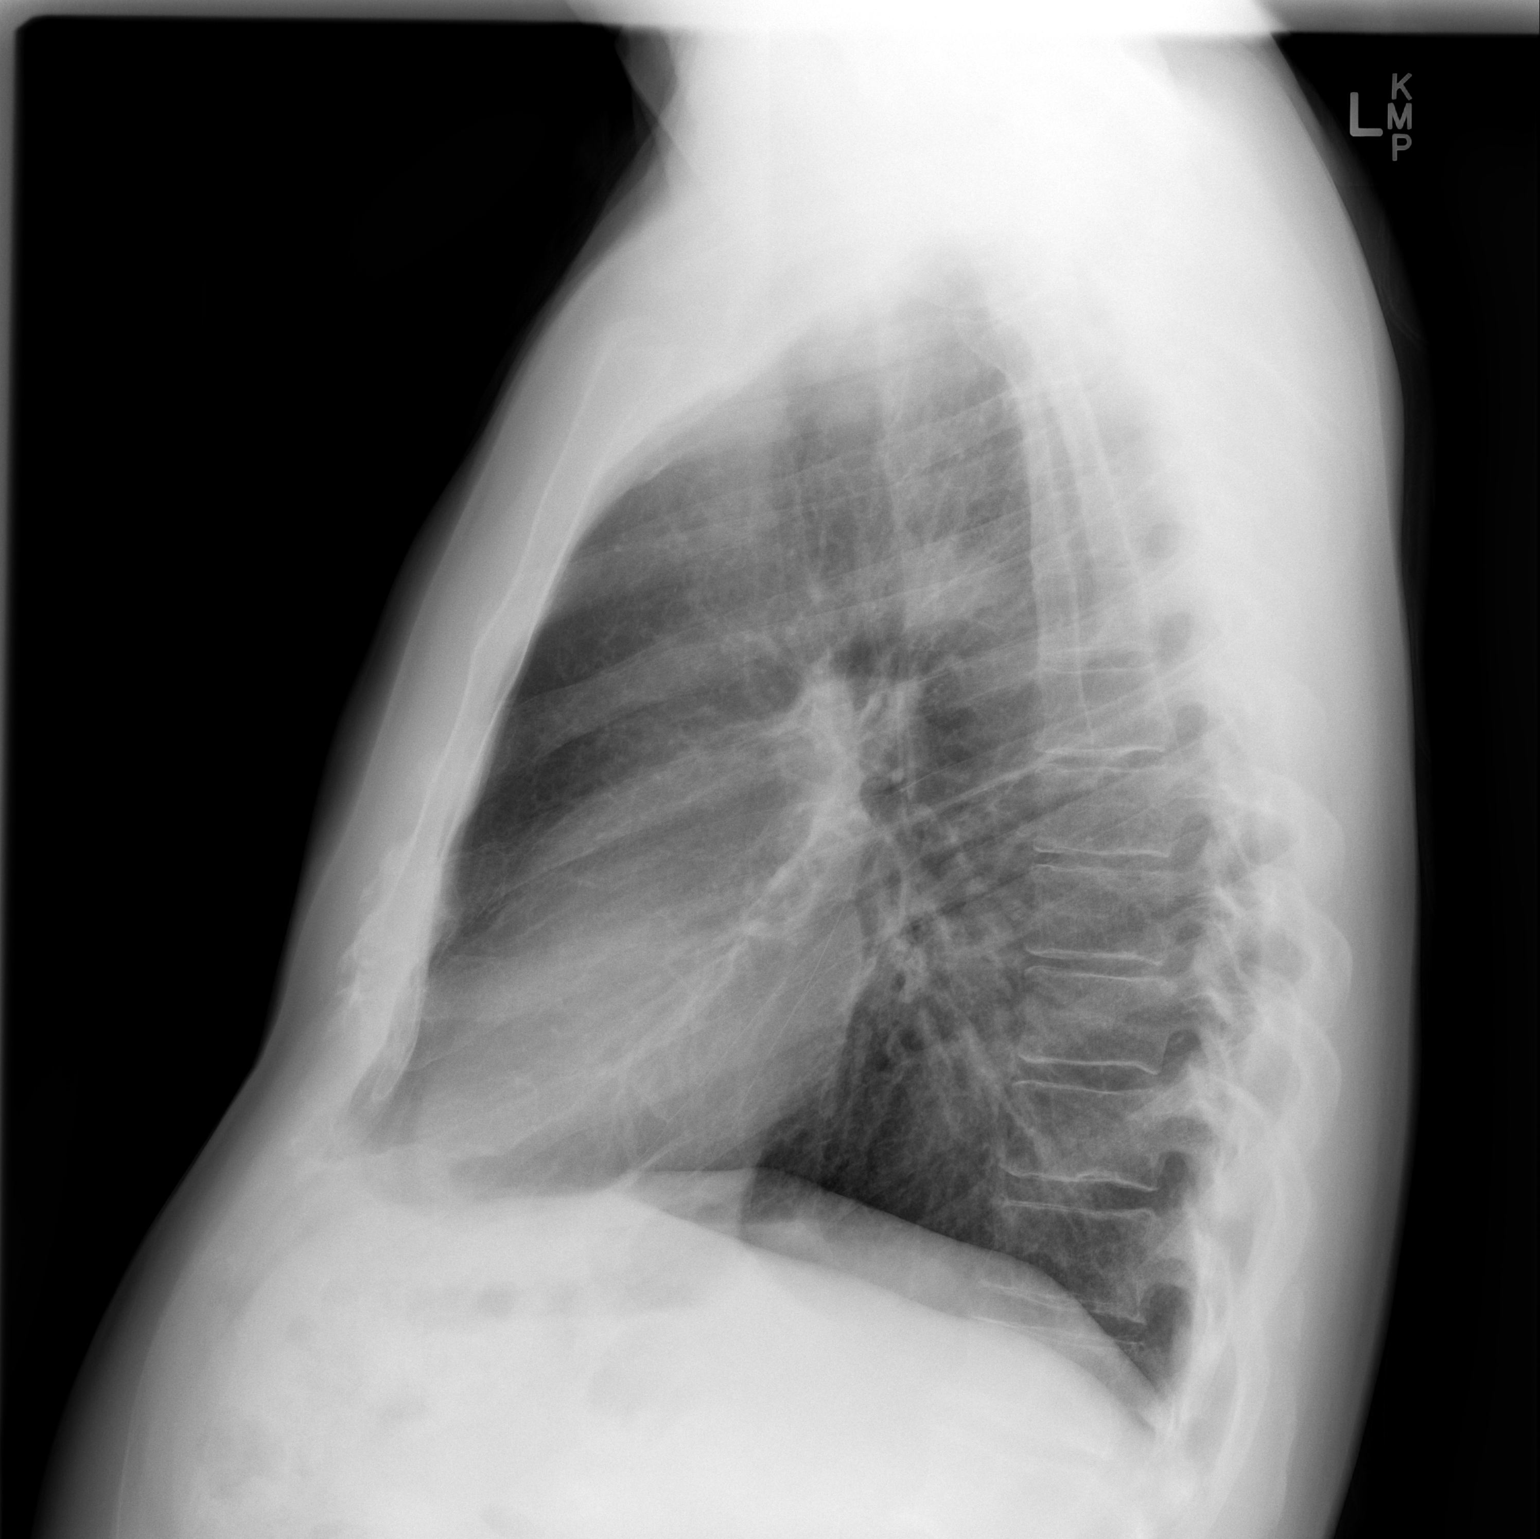

[2 of 2 positions shown; findings below may reference images not displayed]

FINDINGS: The cardiomediastinal silhouette is within normal limits. The lungs
are slightly hyperinflated, unchanged. No airspace consolidation,
edema, pleural effusion, or pneumothorax is identified. No acute
osseous abnormality is identified.
IMPRESSION: No active cardiopulmonary disease.

## 2017-06-29 DIAGNOSIS — F331 Major depressive disorder, recurrent, moderate: Secondary | ICD-10-CM | POA: Diagnosis not present

## 2017-07-13 DIAGNOSIS — F331 Major depressive disorder, recurrent, moderate: Secondary | ICD-10-CM | POA: Diagnosis not present

## 2017-07-18 DIAGNOSIS — H04123 Dry eye syndrome of bilateral lacrimal glands: Secondary | ICD-10-CM | POA: Diagnosis not present

## 2017-08-24 DIAGNOSIS — F331 Major depressive disorder, recurrent, moderate: Secondary | ICD-10-CM | POA: Diagnosis not present

## 2017-08-29 DIAGNOSIS — I10 Essential (primary) hypertension: Secondary | ICD-10-CM | POA: Diagnosis not present

## 2017-08-29 DIAGNOSIS — E7800 Pure hypercholesterolemia, unspecified: Secondary | ICD-10-CM | POA: Diagnosis not present

## 2017-08-29 DIAGNOSIS — E291 Testicular hypofunction: Secondary | ICD-10-CM | POA: Diagnosis not present

## 2017-08-29 DIAGNOSIS — M7582 Other shoulder lesions, left shoulder: Secondary | ICD-10-CM | POA: Diagnosis not present

## 2017-08-29 DIAGNOSIS — F419 Anxiety disorder, unspecified: Secondary | ICD-10-CM | POA: Diagnosis not present

## 2017-09-07 DIAGNOSIS — F331 Major depressive disorder, recurrent, moderate: Secondary | ICD-10-CM | POA: Diagnosis not present

## 2017-09-09 DIAGNOSIS — M79642 Pain in left hand: Secondary | ICD-10-CM | POA: Diagnosis not present

## 2017-09-09 DIAGNOSIS — Z043 Encounter for examination and observation following other accident: Secondary | ICD-10-CM | POA: Diagnosis not present

## 2017-09-13 DIAGNOSIS — M25512 Pain in left shoulder: Secondary | ICD-10-CM | POA: Diagnosis not present

## 2017-09-13 DIAGNOSIS — M25511 Pain in right shoulder: Secondary | ICD-10-CM | POA: Diagnosis not present

## 2017-09-20 DIAGNOSIS — M25511 Pain in right shoulder: Secondary | ICD-10-CM | POA: Diagnosis not present

## 2017-09-20 DIAGNOSIS — M25512 Pain in left shoulder: Secondary | ICD-10-CM | POA: Diagnosis not present

## 2017-09-21 DIAGNOSIS — F331 Major depressive disorder, recurrent, moderate: Secondary | ICD-10-CM | POA: Diagnosis not present

## 2017-09-22 DIAGNOSIS — M25512 Pain in left shoulder: Secondary | ICD-10-CM | POA: Diagnosis not present

## 2017-09-22 DIAGNOSIS — M25511 Pain in right shoulder: Secondary | ICD-10-CM | POA: Diagnosis not present

## 2017-09-27 DIAGNOSIS — M25512 Pain in left shoulder: Secondary | ICD-10-CM | POA: Diagnosis not present

## 2017-09-27 DIAGNOSIS — M25511 Pain in right shoulder: Secondary | ICD-10-CM | POA: Diagnosis not present

## 2017-09-29 DIAGNOSIS — M25511 Pain in right shoulder: Secondary | ICD-10-CM | POA: Diagnosis not present

## 2017-09-29 DIAGNOSIS — M25512 Pain in left shoulder: Secondary | ICD-10-CM | POA: Diagnosis not present

## 2017-10-04 DIAGNOSIS — M25512 Pain in left shoulder: Secondary | ICD-10-CM | POA: Diagnosis not present

## 2017-10-04 DIAGNOSIS — M25511 Pain in right shoulder: Secondary | ICD-10-CM | POA: Diagnosis not present

## 2017-10-05 DIAGNOSIS — F331 Major depressive disorder, recurrent, moderate: Secondary | ICD-10-CM | POA: Diagnosis not present

## 2017-10-06 DIAGNOSIS — M25512 Pain in left shoulder: Secondary | ICD-10-CM | POA: Diagnosis not present

## 2017-10-06 DIAGNOSIS — M25511 Pain in right shoulder: Secondary | ICD-10-CM | POA: Diagnosis not present

## 2017-10-11 DIAGNOSIS — M25512 Pain in left shoulder: Secondary | ICD-10-CM | POA: Diagnosis not present

## 2017-10-11 DIAGNOSIS — M25511 Pain in right shoulder: Secondary | ICD-10-CM | POA: Diagnosis not present

## 2017-10-13 DIAGNOSIS — M25512 Pain in left shoulder: Secondary | ICD-10-CM | POA: Diagnosis not present

## 2017-10-13 DIAGNOSIS — M25511 Pain in right shoulder: Secondary | ICD-10-CM | POA: Diagnosis not present

## 2017-10-19 DIAGNOSIS — F331 Major depressive disorder, recurrent, moderate: Secondary | ICD-10-CM | POA: Diagnosis not present

## 2017-10-20 DIAGNOSIS — M25512 Pain in left shoulder: Secondary | ICD-10-CM | POA: Diagnosis not present

## 2017-10-20 DIAGNOSIS — M25511 Pain in right shoulder: Secondary | ICD-10-CM | POA: Diagnosis not present

## 2017-10-25 DIAGNOSIS — M25512 Pain in left shoulder: Secondary | ICD-10-CM | POA: Diagnosis not present

## 2017-10-25 DIAGNOSIS — M25511 Pain in right shoulder: Secondary | ICD-10-CM | POA: Diagnosis not present

## 2017-10-27 DIAGNOSIS — M25512 Pain in left shoulder: Secondary | ICD-10-CM | POA: Diagnosis not present

## 2017-10-27 DIAGNOSIS — M25511 Pain in right shoulder: Secondary | ICD-10-CM | POA: Diagnosis not present

## 2017-11-02 DIAGNOSIS — J209 Acute bronchitis, unspecified: Secondary | ICD-10-CM | POA: Diagnosis not present

## 2017-11-08 DIAGNOSIS — M25511 Pain in right shoulder: Secondary | ICD-10-CM | POA: Diagnosis not present

## 2017-11-08 DIAGNOSIS — M25512 Pain in left shoulder: Secondary | ICD-10-CM | POA: Diagnosis not present

## 2017-11-16 DIAGNOSIS — F331 Major depressive disorder, recurrent, moderate: Secondary | ICD-10-CM | POA: Diagnosis not present

## 2017-11-23 ENCOUNTER — Other Ambulatory Visit: Payer: Self-pay | Admitting: Family Medicine

## 2017-11-23 ENCOUNTER — Ambulatory Visit
Admission: RE | Admit: 2017-11-23 | Discharge: 2017-11-23 | Disposition: A | Discharge status: Home / Self Care | Payer: Medicare Other | Source: Ambulatory Visit | Attending: Family Medicine | Admitting: Family Medicine

## 2017-11-23 DIAGNOSIS — R05 Cough: Secondary | ICD-10-CM | POA: Diagnosis not present

## 2017-11-23 DIAGNOSIS — R059 Cough, unspecified: Secondary | ICD-10-CM

## 2017-11-30 DIAGNOSIS — F331 Major depressive disorder, recurrent, moderate: Secondary | ICD-10-CM | POA: Diagnosis not present

## 2017-12-14 DIAGNOSIS — F331 Major depressive disorder, recurrent, moderate: Secondary | ICD-10-CM | POA: Diagnosis not present

## 2017-12-28 DIAGNOSIS — F331 Major depressive disorder, recurrent, moderate: Secondary | ICD-10-CM | POA: Diagnosis not present

## 2017-12-29 DIAGNOSIS — J209 Acute bronchitis, unspecified: Secondary | ICD-10-CM | POA: Diagnosis not present

## 2017-12-29 DIAGNOSIS — E291 Testicular hypofunction: Secondary | ICD-10-CM | POA: Diagnosis not present

## 2018-01-11 DIAGNOSIS — F331 Major depressive disorder, recurrent, moderate: Secondary | ICD-10-CM | POA: Diagnosis not present

## 2018-01-25 DIAGNOSIS — F331 Major depressive disorder, recurrent, moderate: Secondary | ICD-10-CM | POA: Diagnosis not present

## 2018-02-08 DIAGNOSIS — F331 Major depressive disorder, recurrent, moderate: Secondary | ICD-10-CM | POA: Diagnosis not present

## 2018-02-10 DIAGNOSIS — M25511 Pain in right shoulder: Secondary | ICD-10-CM | POA: Diagnosis not present

## 2018-02-10 DIAGNOSIS — M25512 Pain in left shoulder: Secondary | ICD-10-CM | POA: Diagnosis not present

## 2018-02-10 DIAGNOSIS — R05 Cough: Secondary | ICD-10-CM | POA: Diagnosis not present

## 2018-02-10 DIAGNOSIS — Z125 Encounter for screening for malignant neoplasm of prostate: Secondary | ICD-10-CM | POA: Diagnosis not present

## 2018-02-10 DIAGNOSIS — E7800 Pure hypercholesterolemia, unspecified: Secondary | ICD-10-CM | POA: Diagnosis not present

## 2018-02-10 DIAGNOSIS — I10 Essential (primary) hypertension: Secondary | ICD-10-CM | POA: Diagnosis not present

## 2018-02-10 DIAGNOSIS — E291 Testicular hypofunction: Secondary | ICD-10-CM | POA: Diagnosis not present

## 2018-02-10 DIAGNOSIS — J40 Bronchitis, not specified as acute or chronic: Secondary | ICD-10-CM | POA: Diagnosis not present

## 2018-02-10 DIAGNOSIS — Z0000 Encounter for general adult medical examination without abnormal findings: Secondary | ICD-10-CM | POA: Diagnosis not present

## 2018-02-10 DIAGNOSIS — K219 Gastro-esophageal reflux disease without esophagitis: Secondary | ICD-10-CM | POA: Diagnosis not present

## 2018-02-15 DIAGNOSIS — F331 Major depressive disorder, recurrent, moderate: Secondary | ICD-10-CM | POA: Diagnosis not present

## 2018-02-16 DIAGNOSIS — M25512 Pain in left shoulder: Secondary | ICD-10-CM | POA: Diagnosis not present

## 2018-02-16 DIAGNOSIS — M79642 Pain in left hand: Secondary | ICD-10-CM | POA: Diagnosis not present

## 2018-02-16 DIAGNOSIS — M25511 Pain in right shoulder: Secondary | ICD-10-CM | POA: Diagnosis not present

## 2018-02-27 DIAGNOSIS — M79642 Pain in left hand: Secondary | ICD-10-CM | POA: Diagnosis not present

## 2018-02-27 DIAGNOSIS — M25511 Pain in right shoulder: Secondary | ICD-10-CM | POA: Diagnosis not present

## 2018-02-27 DIAGNOSIS — M25512 Pain in left shoulder: Secondary | ICD-10-CM | POA: Diagnosis not present

## 2018-03-07 DIAGNOSIS — M25512 Pain in left shoulder: Secondary | ICD-10-CM | POA: Diagnosis not present

## 2018-03-07 DIAGNOSIS — M25511 Pain in right shoulder: Secondary | ICD-10-CM | POA: Diagnosis not present

## 2018-03-07 DIAGNOSIS — M79642 Pain in left hand: Secondary | ICD-10-CM | POA: Diagnosis not present

## 2018-03-13 DIAGNOSIS — M25512 Pain in left shoulder: Secondary | ICD-10-CM | POA: Diagnosis not present

## 2018-03-13 DIAGNOSIS — M79642 Pain in left hand: Secondary | ICD-10-CM | POA: Diagnosis not present

## 2018-03-13 DIAGNOSIS — M25511 Pain in right shoulder: Secondary | ICD-10-CM | POA: Diagnosis not present

## 2018-03-14 DIAGNOSIS — J0100 Acute maxillary sinusitis, unspecified: Secondary | ICD-10-CM | POA: Diagnosis not present

## 2018-03-15 DIAGNOSIS — M25511 Pain in right shoulder: Secondary | ICD-10-CM | POA: Diagnosis not present

## 2018-03-15 DIAGNOSIS — M79642 Pain in left hand: Secondary | ICD-10-CM | POA: Diagnosis not present

## 2018-03-15 DIAGNOSIS — M25512 Pain in left shoulder: Secondary | ICD-10-CM | POA: Diagnosis not present

## 2018-03-20 DIAGNOSIS — M79642 Pain in left hand: Secondary | ICD-10-CM | POA: Diagnosis not present

## 2018-03-20 DIAGNOSIS — M25512 Pain in left shoulder: Secondary | ICD-10-CM | POA: Diagnosis not present

## 2018-03-20 DIAGNOSIS — M25511 Pain in right shoulder: Secondary | ICD-10-CM | POA: Diagnosis not present

## 2018-03-23 DIAGNOSIS — M79642 Pain in left hand: Secondary | ICD-10-CM | POA: Diagnosis not present

## 2018-03-23 DIAGNOSIS — M25512 Pain in left shoulder: Secondary | ICD-10-CM | POA: Diagnosis not present

## 2018-03-23 DIAGNOSIS — M25511 Pain in right shoulder: Secondary | ICD-10-CM | POA: Diagnosis not present

## 2018-03-29 DIAGNOSIS — M25511 Pain in right shoulder: Secondary | ICD-10-CM | POA: Diagnosis not present

## 2018-03-29 DIAGNOSIS — M79642 Pain in left hand: Secondary | ICD-10-CM | POA: Diagnosis not present

## 2018-03-29 DIAGNOSIS — M25512 Pain in left shoulder: Secondary | ICD-10-CM | POA: Diagnosis not present

## 2018-03-31 DIAGNOSIS — M25512 Pain in left shoulder: Secondary | ICD-10-CM | POA: Diagnosis not present

## 2018-03-31 DIAGNOSIS — M79642 Pain in left hand: Secondary | ICD-10-CM | POA: Diagnosis not present

## 2018-03-31 DIAGNOSIS — M25511 Pain in right shoulder: Secondary | ICD-10-CM | POA: Diagnosis not present

## 2018-04-05 DIAGNOSIS — D225 Melanocytic nevi of trunk: Secondary | ICD-10-CM | POA: Diagnosis not present

## 2018-04-05 DIAGNOSIS — D1801 Hemangioma of skin and subcutaneous tissue: Secondary | ICD-10-CM | POA: Diagnosis not present

## 2018-04-05 DIAGNOSIS — L308 Other specified dermatitis: Secondary | ICD-10-CM | POA: Diagnosis not present

## 2018-04-05 DIAGNOSIS — D2261 Melanocytic nevi of right upper limb, including shoulder: Secondary | ICD-10-CM | POA: Diagnosis not present

## 2018-04-05 DIAGNOSIS — L814 Other melanin hyperpigmentation: Secondary | ICD-10-CM | POA: Diagnosis not present

## 2018-04-05 DIAGNOSIS — L821 Other seborrheic keratosis: Secondary | ICD-10-CM | POA: Diagnosis not present

## 2018-04-05 DIAGNOSIS — D2262 Melanocytic nevi of left upper limb, including shoulder: Secondary | ICD-10-CM | POA: Diagnosis not present

## 2018-04-05 DIAGNOSIS — D2272 Melanocytic nevi of left lower limb, including hip: Secondary | ICD-10-CM | POA: Diagnosis not present

## 2018-04-05 DIAGNOSIS — L718 Other rosacea: Secondary | ICD-10-CM | POA: Diagnosis not present

## 2018-04-05 DIAGNOSIS — D2271 Melanocytic nevi of right lower limb, including hip: Secondary | ICD-10-CM | POA: Diagnosis not present

## 2018-04-10 DIAGNOSIS — M25511 Pain in right shoulder: Secondary | ICD-10-CM | POA: Diagnosis not present

## 2018-04-10 DIAGNOSIS — M25512 Pain in left shoulder: Secondary | ICD-10-CM | POA: Diagnosis not present

## 2018-04-10 DIAGNOSIS — M79642 Pain in left hand: Secondary | ICD-10-CM | POA: Diagnosis not present

## 2018-04-12 DIAGNOSIS — M25512 Pain in left shoulder: Secondary | ICD-10-CM | POA: Diagnosis not present

## 2018-04-12 DIAGNOSIS — M79642 Pain in left hand: Secondary | ICD-10-CM | POA: Diagnosis not present

## 2018-04-12 DIAGNOSIS — M25511 Pain in right shoulder: Secondary | ICD-10-CM | POA: Diagnosis not present

## 2018-04-13 DIAGNOSIS — H6691 Otitis media, unspecified, right ear: Secondary | ICD-10-CM | POA: Diagnosis not present

## 2018-04-17 DIAGNOSIS — M79642 Pain in left hand: Secondary | ICD-10-CM | POA: Diagnosis not present

## 2018-04-17 DIAGNOSIS — M25511 Pain in right shoulder: Secondary | ICD-10-CM | POA: Diagnosis not present

## 2018-04-17 DIAGNOSIS — M25512 Pain in left shoulder: Secondary | ICD-10-CM | POA: Diagnosis not present

## 2018-04-26 DIAGNOSIS — M25511 Pain in right shoulder: Secondary | ICD-10-CM | POA: Diagnosis not present

## 2018-04-26 DIAGNOSIS — M79642 Pain in left hand: Secondary | ICD-10-CM | POA: Diagnosis not present

## 2018-04-26 DIAGNOSIS — M25512 Pain in left shoulder: Secondary | ICD-10-CM | POA: Diagnosis not present

## 2018-05-03 DIAGNOSIS — M25512 Pain in left shoulder: Secondary | ICD-10-CM | POA: Diagnosis not present

## 2018-05-03 DIAGNOSIS — M25511 Pain in right shoulder: Secondary | ICD-10-CM | POA: Diagnosis not present

## 2018-05-03 DIAGNOSIS — M79642 Pain in left hand: Secondary | ICD-10-CM | POA: Diagnosis not present

## 2018-05-05 DIAGNOSIS — M79642 Pain in left hand: Secondary | ICD-10-CM | POA: Diagnosis not present

## 2018-05-05 DIAGNOSIS — M25512 Pain in left shoulder: Secondary | ICD-10-CM | POA: Diagnosis not present

## 2018-05-05 DIAGNOSIS — M25511 Pain in right shoulder: Secondary | ICD-10-CM | POA: Diagnosis not present

## 2018-05-10 DIAGNOSIS — H25013 Cortical age-related cataract, bilateral: Secondary | ICD-10-CM | POA: Diagnosis not present

## 2018-05-10 DIAGNOSIS — H2513 Age-related nuclear cataract, bilateral: Secondary | ICD-10-CM | POA: Diagnosis not present

## 2018-05-10 DIAGNOSIS — H25043 Posterior subcapsular polar age-related cataract, bilateral: Secondary | ICD-10-CM | POA: Diagnosis not present

## 2018-05-12 DIAGNOSIS — M25512 Pain in left shoulder: Secondary | ICD-10-CM | POA: Diagnosis not present

## 2018-05-12 DIAGNOSIS — M79642 Pain in left hand: Secondary | ICD-10-CM | POA: Diagnosis not present

## 2018-05-12 DIAGNOSIS — M25511 Pain in right shoulder: Secondary | ICD-10-CM | POA: Diagnosis not present

## 2018-05-16 DIAGNOSIS — M79642 Pain in left hand: Secondary | ICD-10-CM | POA: Diagnosis not present

## 2018-05-16 DIAGNOSIS — M25511 Pain in right shoulder: Secondary | ICD-10-CM | POA: Diagnosis not present

## 2018-05-16 DIAGNOSIS — M25512 Pain in left shoulder: Secondary | ICD-10-CM | POA: Diagnosis not present

## 2018-05-18 DIAGNOSIS — M79642 Pain in left hand: Secondary | ICD-10-CM | POA: Diagnosis not present

## 2018-05-18 DIAGNOSIS — M25512 Pain in left shoulder: Secondary | ICD-10-CM | POA: Diagnosis not present

## 2018-05-18 DIAGNOSIS — M25511 Pain in right shoulder: Secondary | ICD-10-CM | POA: Diagnosis not present

## 2018-05-29 DIAGNOSIS — E7800 Pure hypercholesterolemia, unspecified: Secondary | ICD-10-CM | POA: Diagnosis not present

## 2018-05-29 DIAGNOSIS — F419 Anxiety disorder, unspecified: Secondary | ICD-10-CM | POA: Diagnosis not present

## 2018-05-29 DIAGNOSIS — I10 Essential (primary) hypertension: Secondary | ICD-10-CM | POA: Diagnosis not present

## 2018-05-31 DIAGNOSIS — S8011XA Contusion of right lower leg, initial encounter: Secondary | ICD-10-CM | POA: Diagnosis not present

## 2018-07-13 DIAGNOSIS — M25512 Pain in left shoulder: Secondary | ICD-10-CM | POA: Diagnosis not present

## 2018-07-13 DIAGNOSIS — M25511 Pain in right shoulder: Secondary | ICD-10-CM | POA: Diagnosis not present

## 2018-07-18 DIAGNOSIS — M25512 Pain in left shoulder: Secondary | ICD-10-CM | POA: Diagnosis not present

## 2018-07-18 DIAGNOSIS — M25511 Pain in right shoulder: Secondary | ICD-10-CM | POA: Diagnosis not present

## 2018-07-21 DIAGNOSIS — M25511 Pain in right shoulder: Secondary | ICD-10-CM | POA: Diagnosis not present

## 2018-07-21 DIAGNOSIS — M25512 Pain in left shoulder: Secondary | ICD-10-CM | POA: Diagnosis not present

## 2018-07-24 DIAGNOSIS — H25042 Posterior subcapsular polar age-related cataract, left eye: Secondary | ICD-10-CM | POA: Diagnosis not present

## 2018-07-24 DIAGNOSIS — H25012 Cortical age-related cataract, left eye: Secondary | ICD-10-CM | POA: Diagnosis not present

## 2018-07-24 DIAGNOSIS — H2512 Age-related nuclear cataract, left eye: Secondary | ICD-10-CM | POA: Diagnosis not present

## 2018-07-24 DIAGNOSIS — H25811 Combined forms of age-related cataract, right eye: Secondary | ICD-10-CM | POA: Diagnosis not present

## 2018-07-24 DIAGNOSIS — H2511 Age-related nuclear cataract, right eye: Secondary | ICD-10-CM | POA: Diagnosis not present

## 2018-07-28 DIAGNOSIS — M25512 Pain in left shoulder: Secondary | ICD-10-CM | POA: Diagnosis not present

## 2018-07-28 DIAGNOSIS — M25511 Pain in right shoulder: Secondary | ICD-10-CM | POA: Diagnosis not present

## 2018-08-01 DIAGNOSIS — H2512 Age-related nuclear cataract, left eye: Secondary | ICD-10-CM | POA: Diagnosis not present

## 2018-08-01 DIAGNOSIS — H25812 Combined forms of age-related cataract, left eye: Secondary | ICD-10-CM | POA: Diagnosis not present

## 2018-08-04 DIAGNOSIS — M25512 Pain in left shoulder: Secondary | ICD-10-CM | POA: Diagnosis not present

## 2018-08-04 DIAGNOSIS — M25511 Pain in right shoulder: Secondary | ICD-10-CM | POA: Diagnosis not present

## 2018-08-08 DIAGNOSIS — M25512 Pain in left shoulder: Secondary | ICD-10-CM | POA: Diagnosis not present

## 2018-08-08 DIAGNOSIS — M25511 Pain in right shoulder: Secondary | ICD-10-CM | POA: Diagnosis not present

## 2018-08-11 DIAGNOSIS — M25512 Pain in left shoulder: Secondary | ICD-10-CM | POA: Diagnosis not present

## 2018-08-11 DIAGNOSIS — M25511 Pain in right shoulder: Secondary | ICD-10-CM | POA: Diagnosis not present

## 2018-08-14 DIAGNOSIS — M25512 Pain in left shoulder: Secondary | ICD-10-CM | POA: Diagnosis not present

## 2018-08-14 DIAGNOSIS — M25511 Pain in right shoulder: Secondary | ICD-10-CM | POA: Diagnosis not present

## 2018-08-15 DIAGNOSIS — I10 Essential (primary) hypertension: Secondary | ICD-10-CM | POA: Diagnosis not present

## 2018-08-15 DIAGNOSIS — E7800 Pure hypercholesterolemia, unspecified: Secondary | ICD-10-CM | POA: Diagnosis not present

## 2018-08-15 DIAGNOSIS — E291 Testicular hypofunction: Secondary | ICD-10-CM | POA: Diagnosis not present

## 2018-08-15 DIAGNOSIS — F419 Anxiety disorder, unspecified: Secondary | ICD-10-CM | POA: Diagnosis not present

## 2018-08-15 DIAGNOSIS — M25511 Pain in right shoulder: Secondary | ICD-10-CM | POA: Diagnosis not present

## 2018-08-18 ENCOUNTER — Other Ambulatory Visit: Payer: Self-pay | Admitting: Family Medicine

## 2018-08-18 DIAGNOSIS — M25511 Pain in right shoulder: Secondary | ICD-10-CM | POA: Diagnosis not present

## 2018-08-18 DIAGNOSIS — M25512 Pain in left shoulder: Secondary | ICD-10-CM | POA: Diagnosis not present

## 2018-08-21 DIAGNOSIS — M25511 Pain in right shoulder: Secondary | ICD-10-CM | POA: Diagnosis not present

## 2018-08-21 DIAGNOSIS — M25512 Pain in left shoulder: Secondary | ICD-10-CM | POA: Diagnosis not present

## 2018-08-24 DIAGNOSIS — M25511 Pain in right shoulder: Secondary | ICD-10-CM | POA: Diagnosis not present

## 2018-08-24 DIAGNOSIS — M25512 Pain in left shoulder: Secondary | ICD-10-CM | POA: Diagnosis not present

## 2018-08-28 DIAGNOSIS — M25511 Pain in right shoulder: Secondary | ICD-10-CM | POA: Diagnosis not present

## 2018-08-28 DIAGNOSIS — M25512 Pain in left shoulder: Secondary | ICD-10-CM | POA: Diagnosis not present

## 2018-09-07 DIAGNOSIS — M25512 Pain in left shoulder: Secondary | ICD-10-CM | POA: Diagnosis not present

## 2018-09-07 DIAGNOSIS — M25511 Pain in right shoulder: Secondary | ICD-10-CM | POA: Diagnosis not present

## 2018-09-12 ENCOUNTER — Other Ambulatory Visit: Payer: Medicare Other

## 2018-09-12 DIAGNOSIS — M25512 Pain in left shoulder: Secondary | ICD-10-CM | POA: Diagnosis not present

## 2018-09-12 DIAGNOSIS — M25511 Pain in right shoulder: Secondary | ICD-10-CM | POA: Diagnosis not present

## 2018-09-15 DIAGNOSIS — M25511 Pain in right shoulder: Secondary | ICD-10-CM | POA: Diagnosis not present

## 2018-09-15 DIAGNOSIS — M25512 Pain in left shoulder: Secondary | ICD-10-CM | POA: Diagnosis not present

## 2018-09-19 DIAGNOSIS — M25512 Pain in left shoulder: Secondary | ICD-10-CM | POA: Diagnosis not present

## 2018-09-19 DIAGNOSIS — M25511 Pain in right shoulder: Secondary | ICD-10-CM | POA: Diagnosis not present

## 2018-09-22 DIAGNOSIS — M25512 Pain in left shoulder: Secondary | ICD-10-CM | POA: Diagnosis not present

## 2018-09-22 DIAGNOSIS — M25511 Pain in right shoulder: Secondary | ICD-10-CM | POA: Diagnosis not present

## 2018-09-26 DIAGNOSIS — M25511 Pain in right shoulder: Secondary | ICD-10-CM | POA: Diagnosis not present

## 2018-09-26 DIAGNOSIS — M25512 Pain in left shoulder: Secondary | ICD-10-CM | POA: Diagnosis not present

## 2018-10-02 DIAGNOSIS — U071 COVID-19: Secondary | ICD-10-CM | POA: Diagnosis not present

## 2018-10-10 DIAGNOSIS — M25512 Pain in left shoulder: Secondary | ICD-10-CM | POA: Diagnosis not present

## 2018-10-10 DIAGNOSIS — M25511 Pain in right shoulder: Secondary | ICD-10-CM | POA: Diagnosis not present

## 2018-10-12 DIAGNOSIS — M25512 Pain in left shoulder: Secondary | ICD-10-CM | POA: Diagnosis not present

## 2018-10-12 DIAGNOSIS — M25511 Pain in right shoulder: Secondary | ICD-10-CM | POA: Diagnosis not present

## 2018-10-13 ENCOUNTER — Ambulatory Visit
Admission: RE | Admit: 2018-10-13 | Discharge: 2018-10-13 | Disposition: A | Discharge status: Home / Self Care | Payer: Medicare Other | Source: Ambulatory Visit | Attending: Family Medicine | Admitting: Family Medicine

## 2018-10-13 ENCOUNTER — Other Ambulatory Visit: Payer: Self-pay

## 2018-10-13 DIAGNOSIS — M25511 Pain in right shoulder: Secondary | ICD-10-CM

## 2018-10-17 DIAGNOSIS — M25512 Pain in left shoulder: Secondary | ICD-10-CM | POA: Diagnosis not present

## 2018-10-17 DIAGNOSIS — M25511 Pain in right shoulder: Secondary | ICD-10-CM | POA: Diagnosis not present

## 2018-10-19 DIAGNOSIS — M25512 Pain in left shoulder: Secondary | ICD-10-CM | POA: Diagnosis not present

## 2018-10-19 DIAGNOSIS — M25511 Pain in right shoulder: Secondary | ICD-10-CM | POA: Diagnosis not present

## 2018-11-08 DIAGNOSIS — M25511 Pain in right shoulder: Secondary | ICD-10-CM | POA: Diagnosis not present

## 2018-11-08 DIAGNOSIS — M7541 Impingement syndrome of right shoulder: Secondary | ICD-10-CM | POA: Diagnosis not present

## 2018-11-14 DIAGNOSIS — F319 Bipolar disorder, unspecified: Secondary | ICD-10-CM | POA: Diagnosis not present

## 2018-11-15 DIAGNOSIS — Z961 Presence of intraocular lens: Secondary | ICD-10-CM | POA: Diagnosis not present

## 2018-11-15 DIAGNOSIS — H26492 Other secondary cataract, left eye: Secondary | ICD-10-CM | POA: Diagnosis not present

## 2018-11-15 DIAGNOSIS — I10 Essential (primary) hypertension: Secondary | ICD-10-CM | POA: Diagnosis not present

## 2018-11-15 DIAGNOSIS — H26491 Other secondary cataract, right eye: Secondary | ICD-10-CM | POA: Diagnosis not present

## 2018-11-20 DIAGNOSIS — M25512 Pain in left shoulder: Secondary | ICD-10-CM | POA: Diagnosis not present

## 2018-11-20 DIAGNOSIS — M25511 Pain in right shoulder: Secondary | ICD-10-CM | POA: Diagnosis not present

## 2018-11-20 DIAGNOSIS — F319 Bipolar disorder, unspecified: Secondary | ICD-10-CM | POA: Diagnosis not present

## 2018-11-22 DIAGNOSIS — H26491 Other secondary cataract, right eye: Secondary | ICD-10-CM | POA: Diagnosis not present

## 2018-11-27 DIAGNOSIS — Z7289 Other problems related to lifestyle: Secondary | ICD-10-CM | POA: Diagnosis not present

## 2018-11-27 DIAGNOSIS — M7541 Impingement syndrome of right shoulder: Secondary | ICD-10-CM | POA: Diagnosis not present

## 2018-11-27 DIAGNOSIS — J3489 Other specified disorders of nose and nasal sinuses: Secondary | ICD-10-CM | POA: Insufficient documentation

## 2018-11-27 DIAGNOSIS — D140 Benign neoplasm of middle ear, nasal cavity and accessory sinuses: Secondary | ICD-10-CM | POA: Diagnosis not present

## 2018-11-27 DIAGNOSIS — F319 Bipolar disorder, unspecified: Secondary | ICD-10-CM | POA: Diagnosis not present

## 2018-11-27 DIAGNOSIS — Z87891 Personal history of nicotine dependence: Secondary | ICD-10-CM | POA: Diagnosis not present

## 2018-11-28 DIAGNOSIS — M25512 Pain in left shoulder: Secondary | ICD-10-CM | POA: Diagnosis not present

## 2018-11-28 DIAGNOSIS — M25511 Pain in right shoulder: Secondary | ICD-10-CM | POA: Diagnosis not present

## 2018-11-29 DIAGNOSIS — I10 Essential (primary) hypertension: Secondary | ICD-10-CM | POA: Diagnosis not present

## 2018-11-29 DIAGNOSIS — E291 Testicular hypofunction: Secondary | ICD-10-CM | POA: Diagnosis not present

## 2018-11-29 DIAGNOSIS — F419 Anxiety disorder, unspecified: Secondary | ICD-10-CM | POA: Diagnosis not present

## 2018-11-30 DIAGNOSIS — M25511 Pain in right shoulder: Secondary | ICD-10-CM | POA: Diagnosis not present

## 2018-11-30 DIAGNOSIS — M25512 Pain in left shoulder: Secondary | ICD-10-CM | POA: Diagnosis not present

## 2018-12-04 DIAGNOSIS — F319 Bipolar disorder, unspecified: Secondary | ICD-10-CM | POA: Diagnosis not present

## 2018-12-05 DIAGNOSIS — M25511 Pain in right shoulder: Secondary | ICD-10-CM | POA: Diagnosis not present

## 2018-12-05 DIAGNOSIS — M25512 Pain in left shoulder: Secondary | ICD-10-CM | POA: Diagnosis not present

## 2018-12-07 DIAGNOSIS — M25512 Pain in left shoulder: Secondary | ICD-10-CM | POA: Diagnosis not present

## 2018-12-07 DIAGNOSIS — M25511 Pain in right shoulder: Secondary | ICD-10-CM | POA: Diagnosis not present

## 2018-12-12 DIAGNOSIS — F319 Bipolar disorder, unspecified: Secondary | ICD-10-CM | POA: Diagnosis not present

## 2018-12-18 DIAGNOSIS — F319 Bipolar disorder, unspecified: Secondary | ICD-10-CM | POA: Diagnosis not present

## 2018-12-25 DIAGNOSIS — F319 Bipolar disorder, unspecified: Secondary | ICD-10-CM | POA: Diagnosis not present

## 2019-01-01 DIAGNOSIS — F319 Bipolar disorder, unspecified: Secondary | ICD-10-CM | POA: Diagnosis not present

## 2019-01-08 DIAGNOSIS — F319 Bipolar disorder, unspecified: Secondary | ICD-10-CM | POA: Diagnosis not present

## 2019-01-15 DIAGNOSIS — F319 Bipolar disorder, unspecified: Secondary | ICD-10-CM | POA: Diagnosis not present

## 2019-01-22 DIAGNOSIS — F319 Bipolar disorder, unspecified: Secondary | ICD-10-CM | POA: Diagnosis not present

## 2019-01-29 DIAGNOSIS — F319 Bipolar disorder, unspecified: Secondary | ICD-10-CM | POA: Diagnosis not present

## 2019-03-12 DIAGNOSIS — F319 Bipolar disorder, unspecified: Secondary | ICD-10-CM | POA: Diagnosis not present

## 2019-03-19 DIAGNOSIS — F319 Bipolar disorder, unspecified: Secondary | ICD-10-CM | POA: Diagnosis not present

## 2019-03-26 DIAGNOSIS — F319 Bipolar disorder, unspecified: Secondary | ICD-10-CM | POA: Diagnosis not present

## 2019-04-02 DIAGNOSIS — F319 Bipolar disorder, unspecified: Secondary | ICD-10-CM | POA: Diagnosis not present

## 2019-04-09 DIAGNOSIS — F319 Bipolar disorder, unspecified: Secondary | ICD-10-CM | POA: Diagnosis not present

## 2019-04-10 DIAGNOSIS — D1801 Hemangioma of skin and subcutaneous tissue: Secondary | ICD-10-CM | POA: Diagnosis not present

## 2019-04-10 DIAGNOSIS — L814 Other melanin hyperpigmentation: Secondary | ICD-10-CM | POA: Diagnosis not present

## 2019-04-10 DIAGNOSIS — L718 Other rosacea: Secondary | ICD-10-CM | POA: Diagnosis not present

## 2019-04-10 DIAGNOSIS — L821 Other seborrheic keratosis: Secondary | ICD-10-CM | POA: Diagnosis not present

## 2019-04-10 DIAGNOSIS — D2261 Melanocytic nevi of right upper limb, including shoulder: Secondary | ICD-10-CM | POA: Diagnosis not present

## 2019-04-10 DIAGNOSIS — B351 Tinea unguium: Secondary | ICD-10-CM | POA: Diagnosis not present

## 2019-04-10 DIAGNOSIS — D225 Melanocytic nevi of trunk: Secondary | ICD-10-CM | POA: Diagnosis not present

## 2019-04-10 DIAGNOSIS — D2262 Melanocytic nevi of left upper limb, including shoulder: Secondary | ICD-10-CM | POA: Diagnosis not present

## 2019-04-16 DIAGNOSIS — F319 Bipolar disorder, unspecified: Secondary | ICD-10-CM | POA: Diagnosis not present

## 2019-04-23 DIAGNOSIS — F319 Bipolar disorder, unspecified: Secondary | ICD-10-CM | POA: Diagnosis not present

## 2019-04-30 DIAGNOSIS — F319 Bipolar disorder, unspecified: Secondary | ICD-10-CM | POA: Diagnosis not present

## 2019-05-07 DIAGNOSIS — F319 Bipolar disorder, unspecified: Secondary | ICD-10-CM | POA: Diagnosis not present

## 2019-05-14 DIAGNOSIS — F419 Anxiety disorder, unspecified: Secondary | ICD-10-CM | POA: Diagnosis not present

## 2019-05-14 DIAGNOSIS — K219 Gastro-esophageal reflux disease without esophagitis: Secondary | ICD-10-CM | POA: Diagnosis not present

## 2019-05-14 DIAGNOSIS — E291 Testicular hypofunction: Secondary | ICD-10-CM | POA: Diagnosis not present

## 2019-05-14 DIAGNOSIS — I10 Essential (primary) hypertension: Secondary | ICD-10-CM | POA: Diagnosis not present

## 2019-05-14 DIAGNOSIS — F324 Major depressive disorder, single episode, in partial remission: Secondary | ICD-10-CM | POA: Diagnosis not present

## 2019-05-14 DIAGNOSIS — F319 Bipolar disorder, unspecified: Secondary | ICD-10-CM | POA: Diagnosis not present

## 2019-05-14 DIAGNOSIS — E7800 Pure hypercholesterolemia, unspecified: Secondary | ICD-10-CM | POA: Diagnosis not present

## 2019-05-21 DIAGNOSIS — F319 Bipolar disorder, unspecified: Secondary | ICD-10-CM | POA: Diagnosis not present

## 2019-05-28 DIAGNOSIS — F319 Bipolar disorder, unspecified: Secondary | ICD-10-CM | POA: Diagnosis not present

## 2019-06-04 DIAGNOSIS — F319 Bipolar disorder, unspecified: Secondary | ICD-10-CM | POA: Diagnosis not present

## 2019-06-11 DIAGNOSIS — F319 Bipolar disorder, unspecified: Secondary | ICD-10-CM | POA: Diagnosis not present

## 2019-06-18 DIAGNOSIS — F319 Bipolar disorder, unspecified: Secondary | ICD-10-CM | POA: Diagnosis not present

## 2019-06-25 DIAGNOSIS — F319 Bipolar disorder, unspecified: Secondary | ICD-10-CM | POA: Diagnosis not present

## 2019-07-02 DIAGNOSIS — F319 Bipolar disorder, unspecified: Secondary | ICD-10-CM | POA: Diagnosis not present

## 2019-07-09 DIAGNOSIS — F319 Bipolar disorder, unspecified: Secondary | ICD-10-CM | POA: Diagnosis not present

## 2019-08-24 DIAGNOSIS — E7800 Pure hypercholesterolemia, unspecified: Secondary | ICD-10-CM | POA: Diagnosis not present

## 2019-08-24 DIAGNOSIS — F324 Major depressive disorder, single episode, in partial remission: Secondary | ICD-10-CM | POA: Diagnosis not present

## 2019-08-24 DIAGNOSIS — E291 Testicular hypofunction: Secondary | ICD-10-CM | POA: Diagnosis not present

## 2019-08-24 DIAGNOSIS — F419 Anxiety disorder, unspecified: Secondary | ICD-10-CM | POA: Diagnosis not present

## 2019-08-24 DIAGNOSIS — I10 Essential (primary) hypertension: Secondary | ICD-10-CM | POA: Diagnosis not present

## 2019-09-10 DIAGNOSIS — F319 Bipolar disorder, unspecified: Secondary | ICD-10-CM | POA: Diagnosis not present

## 2019-09-17 DIAGNOSIS — F319 Bipolar disorder, unspecified: Secondary | ICD-10-CM | POA: Diagnosis not present

## 2019-09-24 DIAGNOSIS — F319 Bipolar disorder, unspecified: Secondary | ICD-10-CM | POA: Diagnosis not present

## 2019-10-01 DIAGNOSIS — F319 Bipolar disorder, unspecified: Secondary | ICD-10-CM | POA: Diagnosis not present

## 2019-10-08 DIAGNOSIS — F319 Bipolar disorder, unspecified: Secondary | ICD-10-CM | POA: Diagnosis not present

## 2019-10-15 DIAGNOSIS — F319 Bipolar disorder, unspecified: Secondary | ICD-10-CM | POA: Diagnosis not present

## 2019-10-22 DIAGNOSIS — F319 Bipolar disorder, unspecified: Secondary | ICD-10-CM | POA: Diagnosis not present

## 2019-11-05 DIAGNOSIS — F319 Bipolar disorder, unspecified: Secondary | ICD-10-CM | POA: Diagnosis not present

## 2019-11-13 DIAGNOSIS — F319 Bipolar disorder, unspecified: Secondary | ICD-10-CM | POA: Diagnosis not present

## 2019-11-16 DIAGNOSIS — E7800 Pure hypercholesterolemia, unspecified: Secondary | ICD-10-CM | POA: Diagnosis not present

## 2019-11-16 DIAGNOSIS — M79641 Pain in right hand: Secondary | ICD-10-CM | POA: Diagnosis not present

## 2019-11-16 DIAGNOSIS — E291 Testicular hypofunction: Secondary | ICD-10-CM | POA: Diagnosis not present

## 2019-11-16 DIAGNOSIS — I10 Essential (primary) hypertension: Secondary | ICD-10-CM | POA: Diagnosis not present

## 2019-11-16 DIAGNOSIS — Z125 Encounter for screening for malignant neoplasm of prostate: Secondary | ICD-10-CM | POA: Diagnosis not present

## 2019-11-19 DIAGNOSIS — F319 Bipolar disorder, unspecified: Secondary | ICD-10-CM | POA: Diagnosis not present

## 2019-11-26 DIAGNOSIS — F319 Bipolar disorder, unspecified: Secondary | ICD-10-CM | POA: Diagnosis not present

## 2019-12-03 DIAGNOSIS — F319 Bipolar disorder, unspecified: Secondary | ICD-10-CM | POA: Diagnosis not present

## 2019-12-10 DIAGNOSIS — F319 Bipolar disorder, unspecified: Secondary | ICD-10-CM | POA: Diagnosis not present

## 2019-12-17 DIAGNOSIS — B078 Other viral warts: Secondary | ICD-10-CM | POA: Diagnosis not present

## 2019-12-17 DIAGNOSIS — S60512A Abrasion of left hand, initial encounter: Secondary | ICD-10-CM | POA: Diagnosis not present

## 2019-12-17 DIAGNOSIS — F319 Bipolar disorder, unspecified: Secondary | ICD-10-CM | POA: Diagnosis not present

## 2019-12-24 DIAGNOSIS — F319 Bipolar disorder, unspecified: Secondary | ICD-10-CM | POA: Diagnosis not present

## 2019-12-31 DIAGNOSIS — F319 Bipolar disorder, unspecified: Secondary | ICD-10-CM | POA: Diagnosis not present

## 2020-01-07 DIAGNOSIS — F319 Bipolar disorder, unspecified: Secondary | ICD-10-CM | POA: Diagnosis not present

## 2020-01-14 DIAGNOSIS — F319 Bipolar disorder, unspecified: Secondary | ICD-10-CM | POA: Diagnosis not present

## 2020-01-21 DIAGNOSIS — F319 Bipolar disorder, unspecified: Secondary | ICD-10-CM | POA: Diagnosis not present

## 2020-02-22 DIAGNOSIS — E291 Testicular hypofunction: Secondary | ICD-10-CM | POA: Diagnosis not present

## 2020-02-22 DIAGNOSIS — J309 Allergic rhinitis, unspecified: Secondary | ICD-10-CM | POA: Diagnosis not present

## 2020-02-22 DIAGNOSIS — E7800 Pure hypercholesterolemia, unspecified: Secondary | ICD-10-CM | POA: Diagnosis not present

## 2020-02-22 DIAGNOSIS — R490 Dysphonia: Secondary | ICD-10-CM | POA: Diagnosis not present

## 2020-02-22 DIAGNOSIS — F419 Anxiety disorder, unspecified: Secondary | ICD-10-CM | POA: Diagnosis not present

## 2020-02-22 DIAGNOSIS — F324 Major depressive disorder, single episode, in partial remission: Secondary | ICD-10-CM | POA: Diagnosis not present

## 2020-02-22 DIAGNOSIS — M2559 Pain in other specified joint: Secondary | ICD-10-CM | POA: Diagnosis not present

## 2020-02-22 DIAGNOSIS — I10 Essential (primary) hypertension: Secondary | ICD-10-CM | POA: Diagnosis not present

## 2020-02-22 DIAGNOSIS — Z0000 Encounter for general adult medical examination without abnormal findings: Secondary | ICD-10-CM | POA: Diagnosis not present

## 2020-03-13 ENCOUNTER — Ambulatory Visit (INDEPENDENT_AMBULATORY_CARE_PROVIDER_SITE_OTHER): Payer: Medicare PPO

## 2020-03-13 ENCOUNTER — Other Ambulatory Visit: Payer: Self-pay

## 2020-03-13 ENCOUNTER — Other Ambulatory Visit: Payer: Self-pay | Admitting: Podiatry

## 2020-03-13 ENCOUNTER — Ambulatory Visit (INDEPENDENT_AMBULATORY_CARE_PROVIDER_SITE_OTHER): Payer: Medicare PPO | Admitting: Podiatry

## 2020-03-13 DIAGNOSIS — M79672 Pain in left foot: Secondary | ICD-10-CM

## 2020-03-13 DIAGNOSIS — M79671 Pain in right foot: Secondary | ICD-10-CM

## 2020-03-13 DIAGNOSIS — M779 Enthesopathy, unspecified: Secondary | ICD-10-CM

## 2020-03-13 NOTE — Progress Notes (Signed)
  Subjective:  Patient ID: Dustin Mckay, male    DOB: 08-Oct-1945,  MRN: [id number] HPI Chief Complaint  Patient presents with  . Pain    Rt 2nd sub met pain x 1 mo; 8/10 soreness -w/ tingling at BL legs radiates down to BL feet -w/ occasional swelling per pt at Rt foot -pt denies injury tx: icing, elevation and compression     75 y.o. male presents with the above complaint.   ROS: Denies fever chills nausea vomiting muscle aches pains calf pain back pain chest pain shortness of breath.  Past Medical History:  Diagnosis Date  . Depression   . Esophageal reflux   . Gilbert's syndrome   . H/O hypogonadism   . Hyperlipidemia   . Hyperplastic colon polyp   . Hypertension   . IBS (irritable bowel syndrome)   . Pruritus ani    Dr. Crista Luria in past consultation   . Substance abuse ([place])    in recovery      Current Outpatient Medications:  .  escitalopram (LEXAPRO) 5 MG tablet, Take 5 mg by mouth daily., Disp: , Rfl:  .  losartan (COZAAR) 50 MG tablet, Take 1 tablet by mouth daily., Disp: , Rfl:  .  Testosterone 10 MG/ACT (2%) GEL, As directed, Disp: , Rfl:   Allergies  Allergen Reactions  . Codeine Nausea And Vomiting  . Mobic [Meloxicam] Nausea Only  . Paxil [Paroxetine Hcl]     Empty-headed  . Zoloft [Sertraline Hcl]     Headache  . Prozac [Fluoxetine Hcl] Rash   Review of Systems Objective:  There were no vitals filed for this visit.  General: Well developed, nourished, in no acute distress, alert and oriented x3   Dermatological: Skin is warm, dry and supple bilateral. Nails x 10 are well maintained; remaining integument appears unremarkable at this time. There are no open sores, no preulcerative lesions, no rash or signs of infection present.  Vascular: Dorsalis Pedis artery and Posterior Tibial artery pedal pulses are 2/4 bilateral with immedate capillary fill time. Pedal hair growth present. No varicosities and no lower extremity edema present bilateral.    Neruologic: Grossly intact via light touch bilateral. Vibratory intact via tuning fork bilateral. Protective threshold with Semmes Wienstein monofilament intact to all pedal sites bilateral. Patellar and Achilles deep tendon reflexes 2+ bilateral. No Babinski or clonus noted bilateral.   Musculoskeletal: No gross boney pedal deformities bilateral. No pain, crepitus, or limitation noted with foot and ankle range of motion bilateral. Muscular strength 5/5 in all groups tested bilateral.  Pain on palpation and end range of motion of the second metatarsophalangeal joint right greater than left.  Gait: Unassisted, Nonantalgic.    Radiographs:  Radiographs taken today demonstrate an osseously mature individual no significant osseous abnormalities are noted.  No acute findings are.  Minimal edema no signs of infection.  Assessment & Plan:   Assessment: Capsulitis of the second metatarsophalangeal joint right greater than left.  Plan: Discussed etiology pathology conservative surgical therapies injected around the second metatarsophalangeal joint today with 10 mg of Kenalog 5 mg Marcaine bilaterally.  Tolerated procedure well without complications we discussed appropriate shoe gear stretching exercise ice therapy sugar modifications.  We will follow-up with him in about 6 weeks     Dustin Mckay T. [place], [place]

## 2020-10-07 DIAGNOSIS — M25511 Pain in right shoulder: Secondary | ICD-10-CM | POA: Diagnosis not present

## 2020-10-07 DIAGNOSIS — M25512 Pain in left shoulder: Secondary | ICD-10-CM | POA: Diagnosis not present

## 2020-10-09 DIAGNOSIS — M25512 Pain in left shoulder: Secondary | ICD-10-CM | POA: Diagnosis not present

## 2020-10-09 DIAGNOSIS — M25511 Pain in right shoulder: Secondary | ICD-10-CM | POA: Diagnosis not present

## 2020-10-14 DIAGNOSIS — M25512 Pain in left shoulder: Secondary | ICD-10-CM | POA: Diagnosis not present

## 2020-10-14 DIAGNOSIS — M25511 Pain in right shoulder: Secondary | ICD-10-CM | POA: Diagnosis not present

## 2020-10-21 DIAGNOSIS — M25511 Pain in right shoulder: Secondary | ICD-10-CM | POA: Diagnosis not present

## 2020-10-21 DIAGNOSIS — M25512 Pain in left shoulder: Secondary | ICD-10-CM | POA: Diagnosis not present

## 2020-10-28 DIAGNOSIS — R062 Wheezing: Secondary | ICD-10-CM | POA: Diagnosis not present

## 2020-10-28 DIAGNOSIS — I10 Essential (primary) hypertension: Secondary | ICD-10-CM | POA: Diagnosis not present

## 2020-10-28 DIAGNOSIS — R110 Nausea: Secondary | ICD-10-CM | POA: Diagnosis not present

## 2020-10-28 DIAGNOSIS — M25519 Pain in unspecified shoulder: Secondary | ICD-10-CM | POA: Diagnosis not present

## 2020-10-28 DIAGNOSIS — R1084 Generalized abdominal pain: Secondary | ICD-10-CM | POA: Diagnosis not present

## 2020-10-28 DIAGNOSIS — R3915 Urgency of urination: Secondary | ICD-10-CM | POA: Diagnosis not present

## 2020-11-03 ENCOUNTER — Other Ambulatory Visit: Payer: Self-pay | Admitting: Physician Assistant

## 2020-11-03 DIAGNOSIS — R1010 Upper abdominal pain, unspecified: Secondary | ICD-10-CM

## 2020-11-03 DIAGNOSIS — R110 Nausea: Secondary | ICD-10-CM

## 2020-11-03 DIAGNOSIS — K219 Gastro-esophageal reflux disease without esophagitis: Secondary | ICD-10-CM | POA: Diagnosis not present

## 2020-11-05 ENCOUNTER — Other Ambulatory Visit: Payer: Self-pay

## 2020-11-05 ENCOUNTER — Other Ambulatory Visit: Payer: Self-pay | Admitting: Family Medicine

## 2020-11-05 ENCOUNTER — Ambulatory Visit
Admission: RE | Admit: 2020-11-05 | Discharge: 2020-11-05 | Disposition: A | Discharge status: Home / Self Care | Payer: Medicare PPO | Source: Ambulatory Visit | Attending: Physician Assistant | Admitting: Physician Assistant

## 2020-11-05 DIAGNOSIS — R1010 Upper abdominal pain, unspecified: Secondary | ICD-10-CM

## 2020-11-05 DIAGNOSIS — R110 Nausea: Secondary | ICD-10-CM

## 2020-11-17 DIAGNOSIS — R1013 Epigastric pain: Secondary | ICD-10-CM | POA: Diagnosis not present

## 2020-11-17 DIAGNOSIS — R110 Nausea: Secondary | ICD-10-CM | POA: Diagnosis not present

## 2020-11-17 DIAGNOSIS — K449 Diaphragmatic hernia without obstruction or gangrene: Secondary | ICD-10-CM | POA: Diagnosis not present

## 2020-11-17 DIAGNOSIS — K2270 Barrett's esophagus without dysplasia: Secondary | ICD-10-CM | POA: Diagnosis not present

## 2020-11-18 ENCOUNTER — Ambulatory Visit
Admission: RE | Admit: 2020-11-18 | Discharge: 2020-11-18 | Disposition: A | Discharge status: Home / Self Care | Payer: Medicare PPO | Source: Ambulatory Visit | Attending: Family Medicine | Admitting: Family Medicine

## 2020-11-18 DIAGNOSIS — K5730 Diverticulosis of large intestine without perforation or abscess without bleeding: Secondary | ICD-10-CM | POA: Diagnosis not present

## 2020-11-18 DIAGNOSIS — R110 Nausea: Secondary | ICD-10-CM

## 2020-11-18 DIAGNOSIS — I700 Atherosclerosis of aorta: Secondary | ICD-10-CM | POA: Diagnosis not present

## 2020-11-18 MED ORDER — IOPAMIDOL (ISOVUE-300) INJECTION 61%
100.0000 mL | Freq: Once | INTRAVENOUS | Status: AC | PRN
  Administered 2020-11-18: 100 mL via INTRAVENOUS

## 2020-11-19 DIAGNOSIS — K2270 Barrett's esophagus without dysplasia: Secondary | ICD-10-CM | POA: Diagnosis not present

## 2020-11-26 DIAGNOSIS — M25511 Pain in right shoulder: Secondary | ICD-10-CM | POA: Diagnosis not present

## 2020-11-26 DIAGNOSIS — I10 Essential (primary) hypertension: Secondary | ICD-10-CM | POA: Diagnosis not present

## 2020-11-26 DIAGNOSIS — R110 Nausea: Secondary | ICD-10-CM | POA: Diagnosis not present

## 2020-11-26 DIAGNOSIS — Z23 Encounter for immunization: Secondary | ICD-10-CM | POA: Diagnosis not present

## 2020-12-19 DIAGNOSIS — K2270 Barrett's esophagus without dysplasia: Secondary | ICD-10-CM | POA: Diagnosis not present

## 2020-12-19 DIAGNOSIS — K219 Gastro-esophageal reflux disease without esophagitis: Secondary | ICD-10-CM | POA: Diagnosis not present

## 2020-12-19 DIAGNOSIS — Z1211 Encounter for screening for malignant neoplasm of colon: Secondary | ICD-10-CM | POA: Diagnosis not present

## 2020-12-19 DIAGNOSIS — K760 Fatty (change of) liver, not elsewhere classified: Secondary | ICD-10-CM | POA: Diagnosis not present

## 2020-12-31 DIAGNOSIS — M7541 Impingement syndrome of right shoulder: Secondary | ICD-10-CM | POA: Diagnosis not present

## 2021-01-06 DIAGNOSIS — E291 Testicular hypofunction: Secondary | ICD-10-CM | POA: Diagnosis not present

## 2021-01-09 DIAGNOSIS — M13842 Other specified arthritis, left hand: Secondary | ICD-10-CM | POA: Diagnosis not present

## 2021-01-09 DIAGNOSIS — M79641 Pain in right hand: Secondary | ICD-10-CM | POA: Diagnosis not present

## 2021-01-09 DIAGNOSIS — M13841 Other specified arthritis, right hand: Secondary | ICD-10-CM | POA: Diagnosis not present

## 2021-01-28 DIAGNOSIS — E291 Testicular hypofunction: Secondary | ICD-10-CM | POA: Diagnosis not present

## 2021-01-28 DIAGNOSIS — I10 Essential (primary) hypertension: Secondary | ICD-10-CM | POA: Diagnosis not present

## 2021-01-28 DIAGNOSIS — E7800 Pure hypercholesterolemia, unspecified: Secondary | ICD-10-CM | POA: Diagnosis not present

## 2021-02-26 DIAGNOSIS — E291 Testicular hypofunction: Secondary | ICD-10-CM | POA: Diagnosis not present

## 2021-03-12 DIAGNOSIS — J40 Bronchitis, not specified as acute or chronic: Secondary | ICD-10-CM | POA: Diagnosis not present

## 2021-03-12 DIAGNOSIS — J309 Allergic rhinitis, unspecified: Secondary | ICD-10-CM | POA: Diagnosis not present

## 2021-03-12 DIAGNOSIS — F419 Anxiety disorder, unspecified: Secondary | ICD-10-CM | POA: Diagnosis not present

## 2021-03-12 DIAGNOSIS — E7800 Pure hypercholesterolemia, unspecified: Secondary | ICD-10-CM | POA: Diagnosis not present

## 2021-03-12 DIAGNOSIS — F324 Major depressive disorder, single episode, in partial remission: Secondary | ICD-10-CM | POA: Diagnosis not present

## 2021-03-12 DIAGNOSIS — Z125 Encounter for screening for malignant neoplasm of prostate: Secondary | ICD-10-CM | POA: Diagnosis not present

## 2021-03-12 DIAGNOSIS — K219 Gastro-esophageal reflux disease without esophagitis: Secondary | ICD-10-CM | POA: Diagnosis not present

## 2021-03-12 DIAGNOSIS — Z0000 Encounter for general adult medical examination without abnormal findings: Secondary | ICD-10-CM | POA: Diagnosis not present

## 2021-03-12 DIAGNOSIS — E291 Testicular hypofunction: Secondary | ICD-10-CM | POA: Diagnosis not present

## 2021-03-12 DIAGNOSIS — I10 Essential (primary) hypertension: Secondary | ICD-10-CM | POA: Diagnosis not present

## 2021-03-23 IMAGING — MR MRI OF THE RIGHT SHOULDER WITHOUT CONTRAST
4 of 5 series · 21 of 40 positions shown · non-contrast
Comparison: None.

CLINICAL DATA: Acute right shoulder pain, limited range of motion

EXAM:
MRI OF THE RIGHT SHOULDER WITHOUT CONTRAST
TECHNIQUE: Multiplanar, multisequence MR imaging of the shoulder was performed.
No intravenous contrast was administered.

[Series 6: PD fat-sat · axial · right · 4.0mm · 0.44mm/px · z∈[-66,+29]mm · 8 of 23 slices shown (1 of 2)]
[im 1/23]
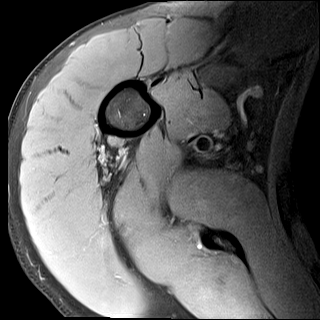
[im 4/23]
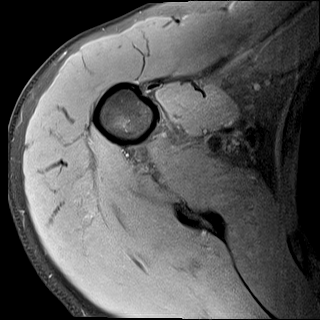
[im 7/23]
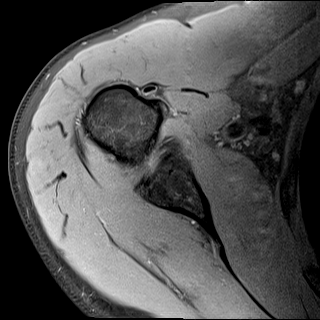
[im 10/23]
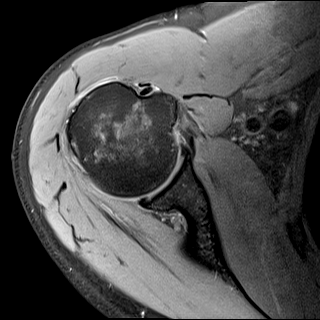
[im 13/23]
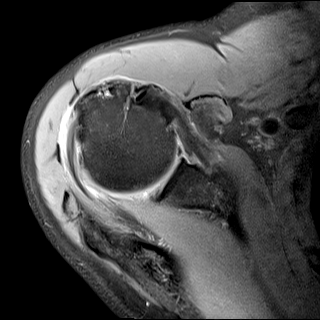
[im 16/23]
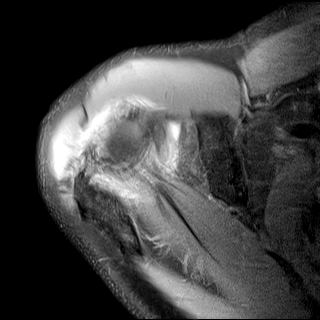
[im 19/23]
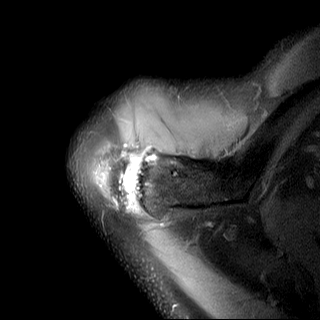
[im 23/23]
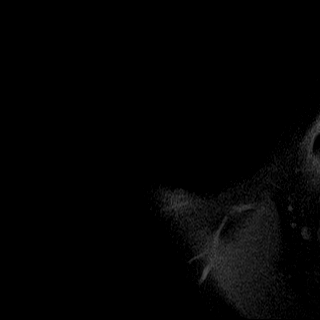

[Series 7: T2 fat-sat · oblique · right · 4.0mm · 0.22mm/px · 3 of 21 slices shown (1 of 2)]
[im 3/21]
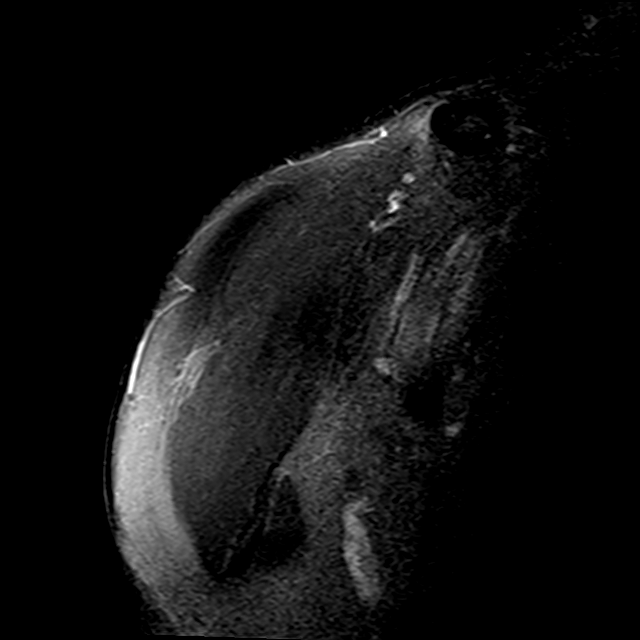
[im 12/21]
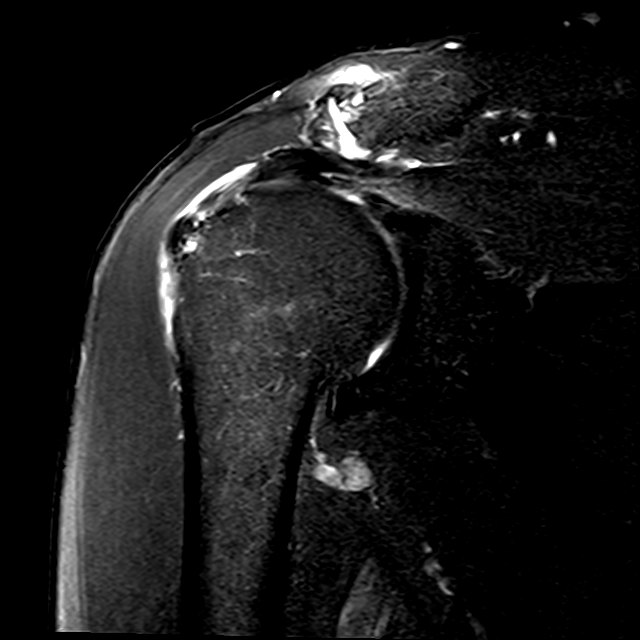
[im 18/21]
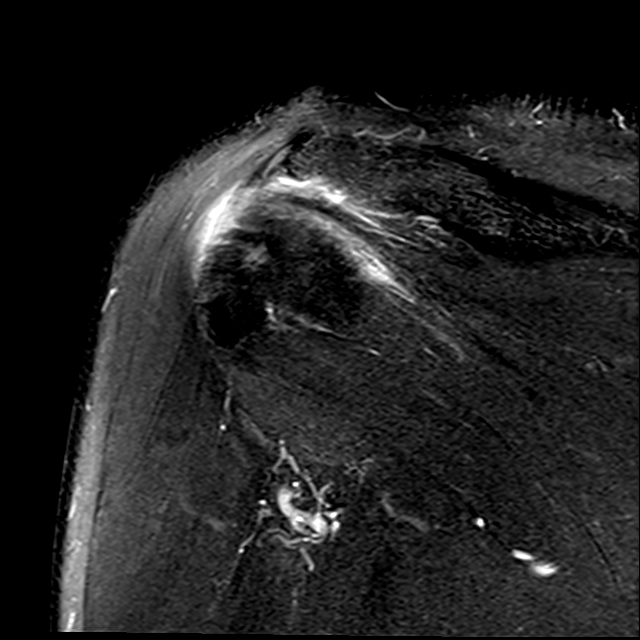

[Series 8: PD fat-sat · oblique · right · 4.0mm · 0.22mm/px · 7 of 21 slices shown (2 of 2)]
[im 1/21]
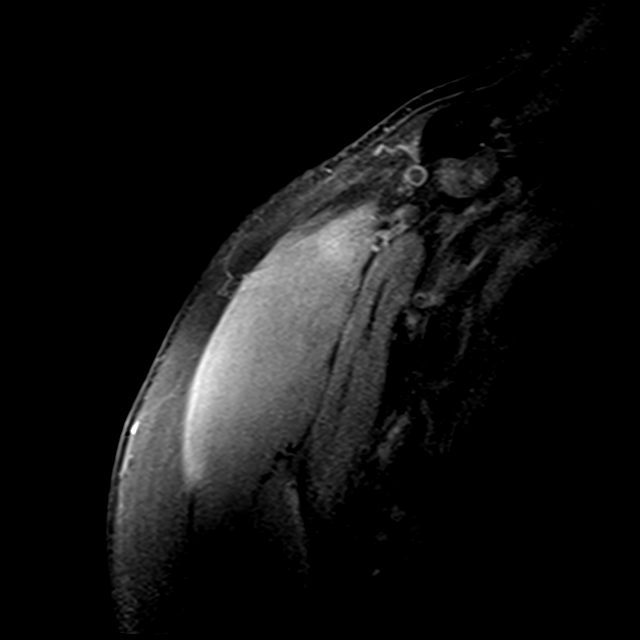
[im 3/21]
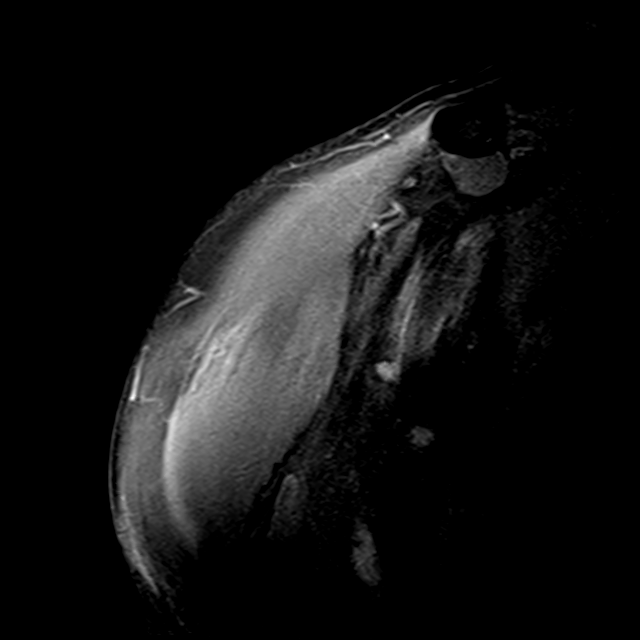
[im 6/21]
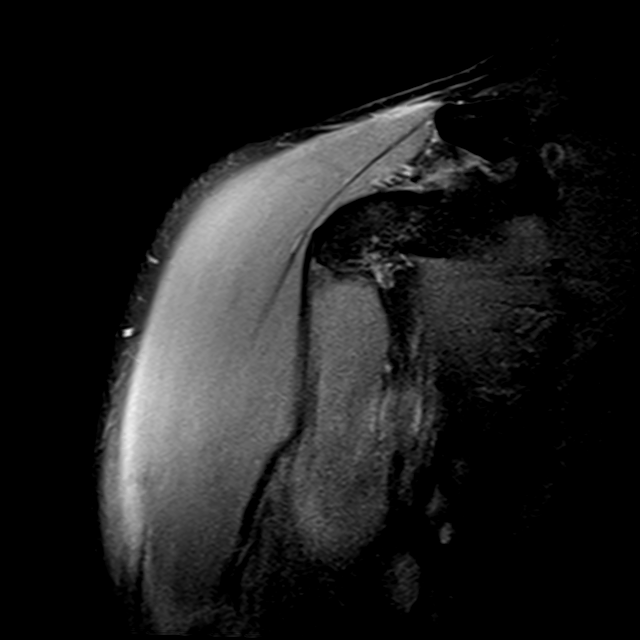
[im 9/21]
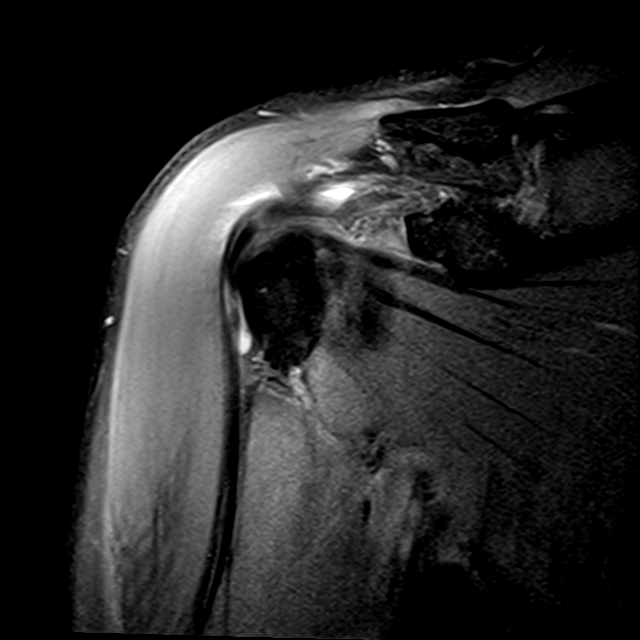
[im 12/21]
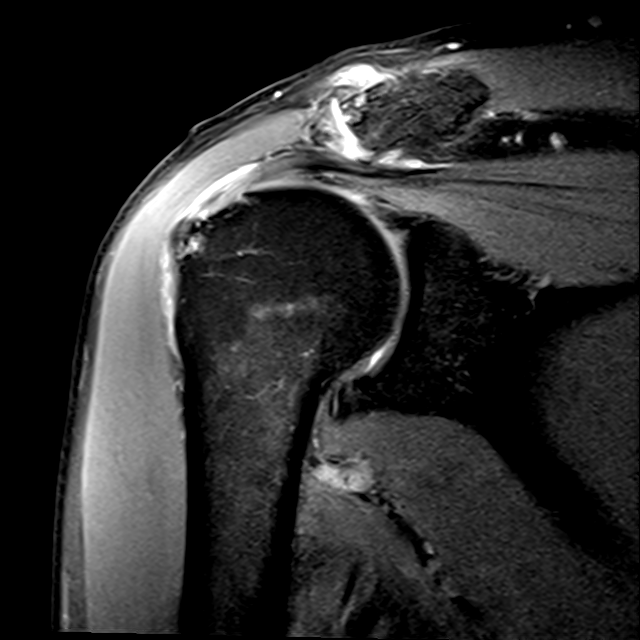
[im 15/21]
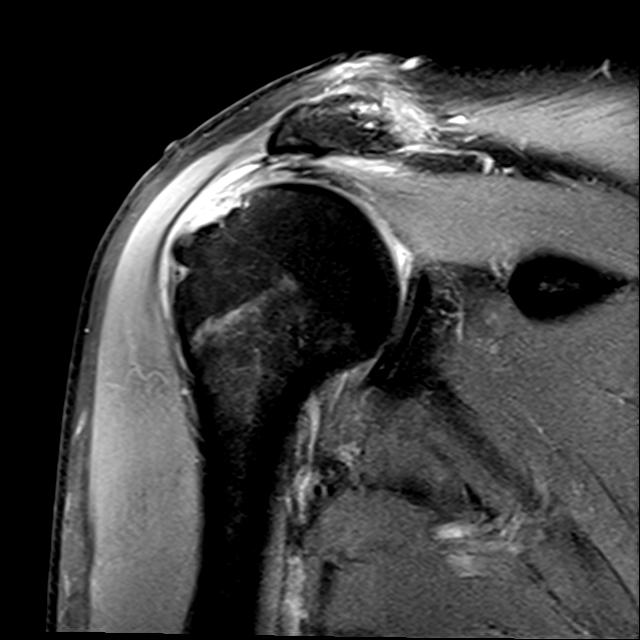
[im 18/21]
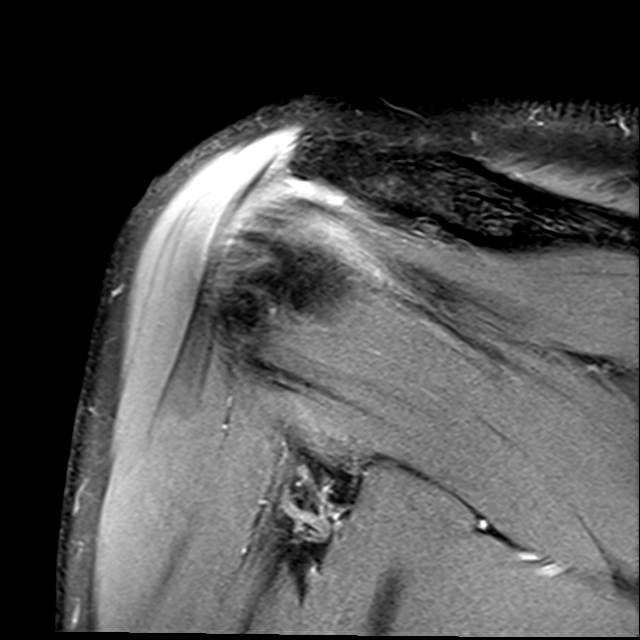

[Series 9: T2 fat-sat · coronal · right · 4.0mm · 0.44mm/px · 3 of 23 slices shown (2 of 2)]
[im 4/23]
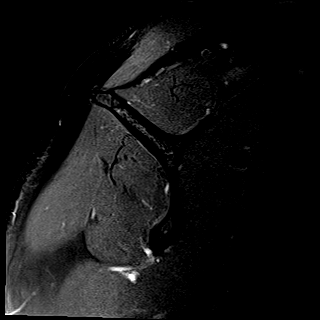
[im 13/23]
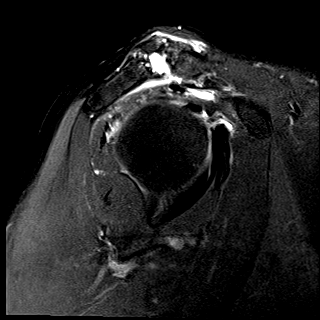
[im 19/23]
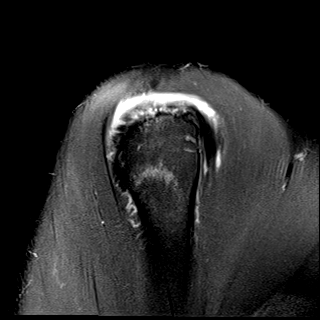

[21 of 40 positions shown; findings below may reference images not displayed]

FINDINGS: Rotator cuff: Moderate tendinosis of the infraspinatus and
supraspinatus tendons without discrete tear. Minimal undersurface
fraying at the footprint of the supraspinatus and infraspinatus.
There may be some mild bursal surface fraying of the infraspinatus
tendon versus overlying irregularity from small volume
subacromial-subdeltoid bursitis. No full-thickness rotator cuff
tear. Subscapularis and teres minor tendons appear intact.

Muscles: No atrophy or abnormal signal of the muscles of the rotator
cuff.

Biceps long head:  Intra-articular biceps tendinosis.

Acromioclavicular Joint: Moderate arthropathy of the
acromioclavicular joint. Type II acromion. Small volume
subacromial-subdeltoid bursitis.

Glenohumeral Joint: No joint effusion. No focal chondral defect
identified.

Labrum: Grossly intact, but evaluation is limited by lack of
intraarticular fluid.

Bones: Reactive marrow changes at the AC joint. No fracture. No
focal bone lesion.

Other: None.
IMPRESSION: 1. Rotator cuff tendinosis with mild undersurface fraying at the
footprint of the supraspinatus and infraspinatus tendons. No full
thickness rotator cuff tear.
2. Mild subacromial-subdeltoid bursitis.
3. Intra-articular biceps tendinosis.
4. Moderate AC joint arthropathy.

## 2021-04-15 DIAGNOSIS — L570 Actinic keratosis: Secondary | ICD-10-CM | POA: Diagnosis not present

## 2021-04-15 DIAGNOSIS — D2271 Melanocytic nevi of right lower limb, including hip: Secondary | ICD-10-CM | POA: Diagnosis not present

## 2021-04-15 DIAGNOSIS — D225 Melanocytic nevi of trunk: Secondary | ICD-10-CM | POA: Diagnosis not present

## 2021-04-15 DIAGNOSIS — L821 Other seborrheic keratosis: Secondary | ICD-10-CM | POA: Diagnosis not present

## 2021-04-15 DIAGNOSIS — L309 Dermatitis, unspecified: Secondary | ICD-10-CM | POA: Diagnosis not present

## 2021-04-15 DIAGNOSIS — D2272 Melanocytic nevi of left lower limb, including hip: Secondary | ICD-10-CM | POA: Diagnosis not present

## 2021-04-15 DIAGNOSIS — D2261 Melanocytic nevi of right upper limb, including shoulder: Secondary | ICD-10-CM | POA: Diagnosis not present

## 2021-04-15 DIAGNOSIS — L814 Other melanin hyperpigmentation: Secondary | ICD-10-CM | POA: Diagnosis not present

## 2021-04-15 DIAGNOSIS — D485 Neoplasm of uncertain behavior of skin: Secondary | ICD-10-CM | POA: Diagnosis not present

## 2021-04-18 DIAGNOSIS — R350 Frequency of micturition: Secondary | ICD-10-CM | POA: Diagnosis not present

## 2021-08-18 DIAGNOSIS — M7541 Impingement syndrome of right shoulder: Secondary | ICD-10-CM | POA: Diagnosis not present

## 2021-08-18 DIAGNOSIS — M25522 Pain in left elbow: Secondary | ICD-10-CM | POA: Diagnosis not present

## 2021-08-18 DIAGNOSIS — M25511 Pain in right shoulder: Secondary | ICD-10-CM | POA: Diagnosis not present

## 2021-09-07 DIAGNOSIS — E291 Testicular hypofunction: Secondary | ICD-10-CM | POA: Diagnosis not present

## 2021-09-07 DIAGNOSIS — K219 Gastro-esophageal reflux disease without esophagitis: Secondary | ICD-10-CM | POA: Diagnosis not present

## 2021-09-07 DIAGNOSIS — K2270 Barrett's esophagus without dysplasia: Secondary | ICD-10-CM | POA: Diagnosis not present

## 2021-09-07 DIAGNOSIS — I10 Essential (primary) hypertension: Secondary | ICD-10-CM | POA: Diagnosis not present

## 2021-09-07 DIAGNOSIS — K760 Fatty (change of) liver, not elsewhere classified: Secondary | ICD-10-CM | POA: Diagnosis not present

## 2021-09-07 DIAGNOSIS — E7800 Pure hypercholesterolemia, unspecified: Secondary | ICD-10-CM | POA: Diagnosis not present

## 2021-09-07 DIAGNOSIS — F324 Major depressive disorder, single episode, in partial remission: Secondary | ICD-10-CM | POA: Diagnosis not present

## 2021-09-10 DIAGNOSIS — M25511 Pain in right shoulder: Secondary | ICD-10-CM | POA: Diagnosis not present

## 2021-09-16 DIAGNOSIS — M25511 Pain in right shoulder: Secondary | ICD-10-CM | POA: Diagnosis not present

## 2021-09-23 DIAGNOSIS — I10 Essential (primary) hypertension: Secondary | ICD-10-CM | POA: Diagnosis not present

## 2021-09-23 DIAGNOSIS — F419 Anxiety disorder, unspecified: Secondary | ICD-10-CM | POA: Diagnosis not present

## 2021-09-23 DIAGNOSIS — Z809 Family history of malignant neoplasm, unspecified: Secondary | ICD-10-CM | POA: Diagnosis not present

## 2021-09-23 DIAGNOSIS — Z791 Long term (current) use of non-steroidal anti-inflammatories (NSAID): Secondary | ICD-10-CM | POA: Diagnosis not present

## 2021-09-23 DIAGNOSIS — E291 Testicular hypofunction: Secondary | ICD-10-CM | POA: Diagnosis not present

## 2021-09-23 DIAGNOSIS — F324 Major depressive disorder, single episode, in partial remission: Secondary | ICD-10-CM | POA: Diagnosis not present

## 2021-09-23 DIAGNOSIS — Z604 Social exclusion and rejection: Secondary | ICD-10-CM | POA: Diagnosis not present

## 2021-09-23 DIAGNOSIS — K219 Gastro-esophageal reflux disease without esophagitis: Secondary | ICD-10-CM | POA: Diagnosis not present

## 2021-09-23 DIAGNOSIS — M1990 Unspecified osteoarthritis, unspecified site: Secondary | ICD-10-CM | POA: Diagnosis not present

## 2021-09-28 DIAGNOSIS — M13841 Other specified arthritis, right hand: Secondary | ICD-10-CM | POA: Diagnosis not present

## 2021-09-28 DIAGNOSIS — M13842 Other specified arthritis, left hand: Secondary | ICD-10-CM | POA: Diagnosis not present

## 2021-10-09 DIAGNOSIS — E291 Testicular hypofunction: Secondary | ICD-10-CM | POA: Diagnosis not present

## 2021-12-28 DIAGNOSIS — K2270 Barrett's esophagus without dysplasia: Secondary | ICD-10-CM | POA: Diagnosis not present

## 2021-12-28 DIAGNOSIS — K760 Fatty (change of) liver, not elsewhere classified: Secondary | ICD-10-CM | POA: Diagnosis not present

## 2021-12-28 DIAGNOSIS — Z1211 Encounter for screening for malignant neoplasm of colon: Secondary | ICD-10-CM | POA: Diagnosis not present

## 2021-12-28 DIAGNOSIS — K219 Gastro-esophageal reflux disease without esophagitis: Secondary | ICD-10-CM | POA: Diagnosis not present

## 2022-01-27 DIAGNOSIS — E291 Testicular hypofunction: Secondary | ICD-10-CM | POA: Diagnosis not present

## 2022-02-26 DIAGNOSIS — E291 Testicular hypofunction: Secondary | ICD-10-CM | POA: Diagnosis not present

## 2022-03-29 DIAGNOSIS — E291 Testicular hypofunction: Secondary | ICD-10-CM | POA: Diagnosis not present

## 2022-04-05 DIAGNOSIS — F419 Anxiety disorder, unspecified: Secondary | ICD-10-CM | POA: Diagnosis not present

## 2022-04-05 DIAGNOSIS — E291 Testicular hypofunction: Secondary | ICD-10-CM | POA: Diagnosis not present

## 2022-04-05 DIAGNOSIS — J309 Allergic rhinitis, unspecified: Secondary | ICD-10-CM | POA: Diagnosis not present

## 2022-04-05 DIAGNOSIS — E7800 Pure hypercholesterolemia, unspecified: Secondary | ICD-10-CM | POA: Diagnosis not present

## 2022-04-05 DIAGNOSIS — Z0000 Encounter for general adult medical examination without abnormal findings: Secondary | ICD-10-CM | POA: Diagnosis not present

## 2022-04-05 DIAGNOSIS — K219 Gastro-esophageal reflux disease without esophagitis: Secondary | ICD-10-CM | POA: Diagnosis not present

## 2022-04-05 DIAGNOSIS — K760 Fatty (change of) liver, not elsewhere classified: Secondary | ICD-10-CM | POA: Diagnosis not present

## 2022-04-05 DIAGNOSIS — I10 Essential (primary) hypertension: Secondary | ICD-10-CM | POA: Diagnosis not present

## 2022-04-05 DIAGNOSIS — F324 Major depressive disorder, single episode, in partial remission: Secondary | ICD-10-CM | POA: Diagnosis not present

## 2022-04-05 DIAGNOSIS — Z125 Encounter for screening for malignant neoplasm of prostate: Secondary | ICD-10-CM | POA: Diagnosis not present

## 2022-04-15 DIAGNOSIS — L814 Other melanin hyperpigmentation: Secondary | ICD-10-CM | POA: Diagnosis not present

## 2022-04-15 DIAGNOSIS — L718 Other rosacea: Secondary | ICD-10-CM | POA: Diagnosis not present

## 2022-04-15 DIAGNOSIS — L821 Other seborrheic keratosis: Secondary | ICD-10-CM | POA: Diagnosis not present

## 2022-04-15 DIAGNOSIS — L720 Epidermal cyst: Secondary | ICD-10-CM | POA: Diagnosis not present

## 2022-04-15 DIAGNOSIS — D2272 Melanocytic nevi of left lower limb, including hip: Secondary | ICD-10-CM | POA: Diagnosis not present

## 2022-04-15 DIAGNOSIS — D225 Melanocytic nevi of trunk: Secondary | ICD-10-CM | POA: Diagnosis not present

## 2022-04-29 DIAGNOSIS — E291 Testicular hypofunction: Secondary | ICD-10-CM | POA: Diagnosis not present

## 2022-05-13 DIAGNOSIS — S96901A Unspecified injury of unspecified muscle and tendon at ankle and foot level, right foot, initial encounter: Secondary | ICD-10-CM | POA: Diagnosis not present

## 2022-05-13 DIAGNOSIS — M722 Plantar fascial fibromatosis: Secondary | ICD-10-CM | POA: Diagnosis not present

## 2022-05-13 DIAGNOSIS — S96909A Unspecified injury of unspecified muscle and tendon at ankle and foot level, unspecified foot, initial encounter: Secondary | ICD-10-CM | POA: Diagnosis not present

## 2022-05-17 ENCOUNTER — Ambulatory Visit: Payer: Medicare PPO | Admitting: Podiatry

## 2022-05-17 DIAGNOSIS — B351 Tinea unguium: Secondary | ICD-10-CM | POA: Diagnosis not present

## 2022-05-17 DIAGNOSIS — M7671 Peroneal tendinitis, right leg: Secondary | ICD-10-CM | POA: Diagnosis not present

## 2022-05-17 MED ORDER — BETAMETHASONE SOD PHOS & ACET 6 (3-3) MG/ML IJ SUSP
3.0000 mg | Freq: Once | INTRAMUSCULAR | Status: AC
  Administered 2022-05-17: 3 mg via INTRA_ARTICULAR

## 2022-05-17 MED ORDER — MELOXICAM 15 MG PO TABS: 15.0000 mg | ORAL_TABLET | Freq: Every day | ORAL | 1 refills | Status: DC

## 2022-05-17 MED ORDER — METHYLPREDNISOLONE 4 MG PO TBPK: ORAL_TABLET | ORAL | 0 refills | Status: AC

## 2022-05-17 NOTE — Progress Notes (Signed)
Chief Complaint  Patient presents with   Foot Pain    Right foot pain, over the weekend patient was seen at [facility] walk-in clinic, plantar fasciitis, arch and lateral side of the foot pain, rate of pain 8 out of 10,     HPI: 77 y.o. male presenting today for new complaint of pain and tenderness associated to the lateral aspect of the right foot.  Patient states that he rolled his foot and noticed lateral foot pain.  Currently he says resting it is a 5/10 on the pain scale.  This happened about 2 weeks ago.  He did go to the urgent care where x-rays were taken.  He brings the report today to review.    Patient is also requesting evaluation of discoloration with thickening to the toenails bilateral.  This is been ongoing for several years.  He has not done anything for treatment.  He does have history of fatty liver.  Presenting for further treatment evaluation  Past Medical History:  Diagnosis Date   Depression    Esophageal reflux    Gilbert's syndrome    H/O hypogonadism    Hyperlipidemia    Hyperplastic colon polyp    Hypertension    IBS (irritable bowel syndrome)    Pruritus ani    Dr. 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  Dermatology: Skin is warm, dry and supple bilateral lower extremities.  Hyperkeratotic dystrophic discolored nails noted 1-5 bilateral consistent with chronic onychomycosis of toenails  Vascular: Palpable pedal pulses bilaterally. Capillary refill within normal limits.  No erythema.  There is some mild to moderate edema noted along the lateral aspect of the right foot Neurological: Light touch and protective threshold  grossly intact  Musculoskeletal Exam: No pedal deformities noted.  There is some tenderness to palpation along the peroneal tendon just distal to the fibular malleolus.  Consistent with peroneal tendinitis  Radiographic Exam 05/13/2022 [facility]:  1.  No acute fracture 2.  Minimal great toe metatarsophalangeal joint osteoarthritis 3.  Mild chronic enthesopathic change at the peroneus brevis tendon insertion onto the base of the fifth metatarsal   Assessment: 1.  Peroneal tendinitis right 2.  Onychomycosis of toenails bilateral   Plan of Care:  1. Patient evaluated. X-Ray report reviewed.  2.  Injection of 0.5 cc Celestone Soluspan injected along the peroneal tendon sheath right 3.  Cam boot dispensed.  WBAT 4.  Compression ankle sleeve dispensed.  Wear daily 5.  Prescription for Medrol Dosepak 6.  Prescription for meloxicam 15 mg daily 7.  OTC Tolcylen antifungal topical dispensed at checkout.  Apply daily  8.  Return to clinic 4 weeks  *Plays for a band      Edrick Kins, DPM [facility]  Dr. Edrick Kins, DPM    [address]  [place], [place] [address]                Office [phone]  Fax [phone]

## 2022-05-28 DIAGNOSIS — E291 Testicular hypofunction: Secondary | ICD-10-CM | POA: Diagnosis not present

## 2022-06-14 ENCOUNTER — Ambulatory Visit: Payer: Medicare PPO | Admitting: Podiatry

## 2022-06-14 DIAGNOSIS — B351 Tinea unguium: Secondary | ICD-10-CM | POA: Diagnosis not present

## 2022-06-14 DIAGNOSIS — M7671 Peroneal tendinitis, right leg: Secondary | ICD-10-CM

## 2022-06-14 MED ORDER — MELOXICAM 15 MG PO TABS: 15.0000 mg | ORAL_TABLET | Freq: Every day | ORAL | 0 refills | Status: DC

## 2022-06-14 MED ORDER — BETAMETHASONE SOD PHOS & ACET 6 (3-3) MG/ML IJ SUSP
3.0000 mg | Freq: Once | INTRAMUSCULAR | Status: AC
  Administered 2022-06-14: 3 mg via INTRA_ARTICULAR

## 2022-06-14 MED ORDER — MELOXICAM 15 MG PO TABS: 15.0000 mg | ORAL_TABLET | Freq: Every day | ORAL | 1 refills | Status: DC

## 2022-06-14 NOTE — Progress Notes (Signed)
   Chief Complaint  Patient presents with   Foot Problem    Patient came in today for right foot pain, patient is still having swelling and some pain, 5 out of 10, TX: mobic (patient needs a refill)    HPI: 77 y.o. male presenting today for follow-up evaluation of peroneal tendinitis to the lateral aspect of the right foot when he rolled his ankle and noticed lateral foot pain initial injury.  Patient states that since last visit there has been improvement.  Overall he feels better but he continues to have some slight tenderness to the area.  Past Medical History:  Diagnosis Date   Depression    Esophageal reflux    Gilbert's syndrome    H/O hypogonadism    Hyperlipidemia    Hyperplastic colon polyp    Hypertension    IBS (irritable bowel syndrome)    Pruritus ani    Dr. Campbell Stall in past consultation    Substance abuse ([facility])    in recovery     Allergies  Allergen Reactions   Codeine Nausea And Vomiting   Mobic [Meloxicam] Nausea Only   Paxil [Paroxetine Hcl]     Empty-headed   Zoloft [Sertraline Hcl]     Headache   Prozac [Fluoxetine Hcl] Rash     Physical Exam: General: The patient is alert and oriented x3 in no acute distress.  Dermatology: Skin is warm, dry and supple bilateral lower extremities.  Hyperkeratotic dystrophic discolored nails noted 1-5 bilateral consistent with chronic onychomycosis of toenails  Vascular: Palpable pedal pulses bilaterally. Capillary refill within normal limits.  No erythema.  There is some mild to moderate edema noted along the lateral aspect of the right foot Neurological: Light touch and protective threshold grossly intact  Musculoskeletal Exam: No pedal deformities noted.  There continues to be some slight some tenderness to palpation along the peroneal tendon just distal to the fibular malleolus.  Consistent with peroneal tendinitis  Radiographic Exam 05/13/2022 [facility]:  1.  No acute fracture 2.  Minimal  great toe metatarsophalangeal joint osteoarthritis 3.  Mild chronic enthesopathic change at the peroneus brevis tendon insertion onto the base of the fifth metatarsal   Assessment: 1.  Peroneal tendinitis right -Injection of 0.5 cc Celestone Soluspan injected around the lateral aspect of the right foot -Refill meloxicam 15 mg daily as needed -Patient declined ankle brace.  He is no longer wearing the cam boot.  Recommend good supportive tennis shoes and sneakers -Return to clinic as needed   2.  Onychomycosis of toenails bilateral -Continue topical Tolcylen antifungal daily  *Plays for a band      Felecia Shelling, DPM [facility]  Dr. Felecia Shelling, DPM    [address] [place], [place] [address]                Office [phone]  Fax [phone]

## 2022-06-25 DIAGNOSIS — E291 Testicular hypofunction: Secondary | ICD-10-CM | POA: Diagnosis not present

## 2022-07-20 DIAGNOSIS — K219 Gastro-esophageal reflux disease without esophagitis: Secondary | ICD-10-CM | POA: Diagnosis not present

## 2022-07-26 DIAGNOSIS — E291 Testicular hypofunction: Secondary | ICD-10-CM | POA: Diagnosis not present

## 2022-08-26 DIAGNOSIS — E291 Testicular hypofunction: Secondary | ICD-10-CM | POA: Diagnosis not present

## 2022-09-04 ENCOUNTER — Other Ambulatory Visit: Payer: Self-pay | Admitting: Podiatry

## 2022-09-30 DIAGNOSIS — E291 Testicular hypofunction: Secondary | ICD-10-CM | POA: Diagnosis not present

## 2022-10-05 DIAGNOSIS — K2270 Barrett's esophagus without dysplasia: Secondary | ICD-10-CM | POA: Diagnosis not present

## 2022-10-05 DIAGNOSIS — E291 Testicular hypofunction: Secondary | ICD-10-CM | POA: Diagnosis not present

## 2022-10-05 DIAGNOSIS — K760 Fatty (change of) liver, not elsewhere classified: Secondary | ICD-10-CM | POA: Diagnosis not present

## 2022-10-05 DIAGNOSIS — F419 Anxiety disorder, unspecified: Secondary | ICD-10-CM | POA: Diagnosis not present

## 2022-10-05 DIAGNOSIS — J309 Allergic rhinitis, unspecified: Secondary | ICD-10-CM | POA: Diagnosis not present

## 2022-10-05 DIAGNOSIS — F324 Major depressive disorder, single episode, in partial remission: Secondary | ICD-10-CM | POA: Diagnosis not present

## 2022-10-05 DIAGNOSIS — E7800 Pure hypercholesterolemia, unspecified: Secondary | ICD-10-CM | POA: Diagnosis not present

## 2022-10-05 DIAGNOSIS — K219 Gastro-esophageal reflux disease without esophagitis: Secondary | ICD-10-CM | POA: Diagnosis not present

## 2022-10-05 DIAGNOSIS — I10 Essential (primary) hypertension: Secondary | ICD-10-CM | POA: Diagnosis not present

## 2022-11-01 DIAGNOSIS — M25511 Pain in right shoulder: Secondary | ICD-10-CM | POA: Diagnosis not present

## 2022-11-01 DIAGNOSIS — M25512 Pain in left shoulder: Secondary | ICD-10-CM | POA: Diagnosis not present

## 2022-11-03 DIAGNOSIS — M13841 Other specified arthritis, right hand: Secondary | ICD-10-CM | POA: Diagnosis not present

## 2022-11-03 DIAGNOSIS — M79644 Pain in right finger(s): Secondary | ICD-10-CM | POA: Diagnosis not present

## 2022-11-03 DIAGNOSIS — M13842 Other specified arthritis, left hand: Secondary | ICD-10-CM | POA: Diagnosis not present

## 2022-11-05 ENCOUNTER — Other Ambulatory Visit: Payer: Self-pay | Admitting: Podiatry

## 2022-11-11 DIAGNOSIS — E291 Testicular hypofunction: Secondary | ICD-10-CM | POA: Diagnosis not present

## 2022-11-17 DIAGNOSIS — N3281 Overactive bladder: Secondary | ICD-10-CM | POA: Diagnosis not present

## 2022-11-17 DIAGNOSIS — R32 Unspecified urinary incontinence: Secondary | ICD-10-CM | POA: Diagnosis not present

## 2022-12-17 DIAGNOSIS — E291 Testicular hypofunction: Secondary | ICD-10-CM | POA: Diagnosis not present

## 2023-01-18 DIAGNOSIS — I10 Essential (primary) hypertension: Secondary | ICD-10-CM | POA: Diagnosis not present

## 2023-01-18 DIAGNOSIS — J069 Acute upper respiratory infection, unspecified: Secondary | ICD-10-CM | POA: Diagnosis not present

## 2023-01-18 DIAGNOSIS — R051 Acute cough: Secondary | ICD-10-CM | POA: Diagnosis not present

## 2023-01-18 DIAGNOSIS — E291 Testicular hypofunction: Secondary | ICD-10-CM | POA: Diagnosis not present

## 2023-01-18 DIAGNOSIS — B9689 Other specified bacterial agents as the cause of diseases classified elsewhere: Secondary | ICD-10-CM | POA: Diagnosis not present

## 2023-01-31 DIAGNOSIS — Z6827 Body mass index (BMI) 27.0-27.9, adult: Secondary | ICD-10-CM | POA: Diagnosis not present

## 2023-01-31 DIAGNOSIS — S29012A Strain of muscle and tendon of back wall of thorax, initial encounter: Secondary | ICD-10-CM | POA: Diagnosis not present

## 2023-02-17 DIAGNOSIS — E291 Testicular hypofunction: Secondary | ICD-10-CM | POA: Diagnosis not present

## 2023-03-21 DIAGNOSIS — E291 Testicular hypofunction: Secondary | ICD-10-CM | POA: Diagnosis not present

## 2023-04-12 DIAGNOSIS — L570 Actinic keratosis: Secondary | ICD-10-CM | POA: Diagnosis not present

## 2023-04-12 DIAGNOSIS — L821 Other seborrheic keratosis: Secondary | ICD-10-CM | POA: Diagnosis not present

## 2023-04-12 DIAGNOSIS — D692 Other nonthrombocytopenic purpura: Secondary | ICD-10-CM | POA: Diagnosis not present

## 2023-04-12 DIAGNOSIS — D225 Melanocytic nevi of trunk: Secondary | ICD-10-CM | POA: Diagnosis not present

## 2023-04-12 DIAGNOSIS — L814 Other melanin hyperpigmentation: Secondary | ICD-10-CM | POA: Diagnosis not present

## 2023-04-18 DIAGNOSIS — Z0000 Encounter for general adult medical examination without abnormal findings: Secondary | ICD-10-CM | POA: Diagnosis not present

## 2023-04-18 DIAGNOSIS — K219 Gastro-esophageal reflux disease without esophagitis: Secondary | ICD-10-CM | POA: Diagnosis not present

## 2023-04-18 DIAGNOSIS — E7800 Pure hypercholesterolemia, unspecified: Secondary | ICD-10-CM | POA: Diagnosis not present

## 2023-04-18 DIAGNOSIS — F419 Anxiety disorder, unspecified: Secondary | ICD-10-CM | POA: Diagnosis not present

## 2023-04-18 DIAGNOSIS — I10 Essential (primary) hypertension: Secondary | ICD-10-CM | POA: Diagnosis not present

## 2023-04-18 DIAGNOSIS — J309 Allergic rhinitis, unspecified: Secondary | ICD-10-CM | POA: Diagnosis not present

## 2023-04-18 DIAGNOSIS — Z125 Encounter for screening for malignant neoplasm of prostate: Secondary | ICD-10-CM | POA: Diagnosis not present

## 2023-04-18 DIAGNOSIS — F324 Major depressive disorder, single episode, in partial remission: Secondary | ICD-10-CM | POA: Diagnosis not present

## 2023-04-18 DIAGNOSIS — E291 Testicular hypofunction: Secondary | ICD-10-CM | POA: Diagnosis not present

## 2023-04-18 DIAGNOSIS — K760 Fatty (change of) liver, not elsewhere classified: Secondary | ICD-10-CM | POA: Diagnosis not present

## 2023-04-21 DIAGNOSIS — E291 Testicular hypofunction: Secondary | ICD-10-CM | POA: Diagnosis not present

## 2023-05-10 DIAGNOSIS — J019 Acute sinusitis, unspecified: Secondary | ICD-10-CM | POA: Diagnosis not present

## 2023-05-16 DIAGNOSIS — M25811 Other specified joint disorders, right shoulder: Secondary | ICD-10-CM | POA: Diagnosis not present

## 2023-05-16 DIAGNOSIS — M25512 Pain in left shoulder: Secondary | ICD-10-CM | POA: Diagnosis not present

## 2023-05-19 DIAGNOSIS — E291 Testicular hypofunction: Secondary | ICD-10-CM | POA: Diagnosis not present

## 2023-05-24 DIAGNOSIS — M75111 Incomplete rotator cuff tear or rupture of right shoulder, not specified as traumatic: Secondary | ICD-10-CM | POA: Diagnosis not present

## 2023-05-24 DIAGNOSIS — G8918 Other acute postprocedural pain: Secondary | ICD-10-CM | POA: Diagnosis not present

## 2023-05-24 DIAGNOSIS — M75121 Complete rotator cuff tear or rupture of right shoulder, not specified as traumatic: Secondary | ICD-10-CM | POA: Diagnosis not present

## 2023-06-07 DIAGNOSIS — Z96611 Presence of right artificial shoulder joint: Secondary | ICD-10-CM | POA: Diagnosis not present

## 2023-06-07 DIAGNOSIS — Z4731 Aftercare following explantation of shoulder joint prosthesis: Secondary | ICD-10-CM | POA: Diagnosis not present

## 2023-06-20 DIAGNOSIS — E291 Testicular hypofunction: Secondary | ICD-10-CM | POA: Diagnosis not present

## 2023-06-27 DIAGNOSIS — Z96611 Presence of right artificial shoulder joint: Secondary | ICD-10-CM | POA: Diagnosis not present

## 2023-06-27 DIAGNOSIS — Z5189 Encounter for other specified aftercare: Secondary | ICD-10-CM | POA: Diagnosis not present

## 2023-07-04 DIAGNOSIS — K921 Melena: Secondary | ICD-10-CM | POA: Diagnosis not present

## 2023-07-04 DIAGNOSIS — K2270 Barrett's esophagus without dysplasia: Secondary | ICD-10-CM | POA: Diagnosis not present

## 2023-07-05 DIAGNOSIS — M25511 Pain in right shoulder: Secondary | ICD-10-CM | POA: Diagnosis not present

## 2023-07-11 DIAGNOSIS — M25511 Pain in right shoulder: Secondary | ICD-10-CM | POA: Diagnosis not present

## 2023-07-14 DIAGNOSIS — M25511 Pain in right shoulder: Secondary | ICD-10-CM | POA: Diagnosis not present

## 2023-07-18 DIAGNOSIS — M25511 Pain in right shoulder: Secondary | ICD-10-CM | POA: Diagnosis not present

## 2023-07-19 DIAGNOSIS — K449 Diaphragmatic hernia without obstruction or gangrene: Secondary | ICD-10-CM | POA: Diagnosis not present

## 2023-07-19 DIAGNOSIS — K2270 Barrett's esophagus without dysplasia: Secondary | ICD-10-CM | POA: Diagnosis not present

## 2023-07-19 DIAGNOSIS — Z13810 Encounter for screening for upper gastrointestinal disorder: Secondary | ICD-10-CM | POA: Diagnosis not present

## 2023-07-21 DIAGNOSIS — K2270 Barrett's esophagus without dysplasia: Secondary | ICD-10-CM | POA: Diagnosis not present

## 2023-07-21 DIAGNOSIS — E291 Testicular hypofunction: Secondary | ICD-10-CM | POA: Diagnosis not present

## 2023-07-22 DIAGNOSIS — M25511 Pain in right shoulder: Secondary | ICD-10-CM | POA: Diagnosis not present

## 2023-07-26 DIAGNOSIS — M25511 Pain in right shoulder: Secondary | ICD-10-CM | POA: Diagnosis not present

## 2023-07-28 DIAGNOSIS — M25511 Pain in right shoulder: Secondary | ICD-10-CM | POA: Diagnosis not present

## 2023-08-02 DIAGNOSIS — M25511 Pain in right shoulder: Secondary | ICD-10-CM | POA: Diagnosis not present

## 2023-08-03 DIAGNOSIS — Z96611 Presence of right artificial shoulder joint: Secondary | ICD-10-CM | POA: Diagnosis not present

## 2023-08-03 DIAGNOSIS — M7542 Impingement syndrome of left shoulder: Secondary | ICD-10-CM | POA: Diagnosis not present

## 2023-08-03 DIAGNOSIS — Z5189 Encounter for other specified aftercare: Secondary | ICD-10-CM | POA: Diagnosis not present

## 2023-08-04 DIAGNOSIS — M25511 Pain in right shoulder: Secondary | ICD-10-CM | POA: Diagnosis not present

## 2023-08-11 DIAGNOSIS — M25511 Pain in right shoulder: Secondary | ICD-10-CM | POA: Diagnosis not present

## 2023-08-15 DIAGNOSIS — M25511 Pain in right shoulder: Secondary | ICD-10-CM | POA: Diagnosis not present

## 2023-08-17 DIAGNOSIS — M25511 Pain in right shoulder: Secondary | ICD-10-CM | POA: Diagnosis not present

## 2023-08-22 DIAGNOSIS — E291 Testicular hypofunction: Secondary | ICD-10-CM | POA: Diagnosis not present

## 2023-08-23 DIAGNOSIS — M25511 Pain in right shoulder: Secondary | ICD-10-CM | POA: Diagnosis not present

## 2023-08-25 DIAGNOSIS — M25511 Pain in right shoulder: Secondary | ICD-10-CM | POA: Diagnosis not present

## 2023-09-06 DIAGNOSIS — M25511 Pain in right shoulder: Secondary | ICD-10-CM | POA: Diagnosis not present

## 2023-09-08 DIAGNOSIS — M25511 Pain in right shoulder: Secondary | ICD-10-CM | POA: Diagnosis not present

## 2023-09-13 DIAGNOSIS — M25511 Pain in right shoulder: Secondary | ICD-10-CM | POA: Diagnosis not present

## 2023-09-14 DIAGNOSIS — Z96611 Presence of right artificial shoulder joint: Secondary | ICD-10-CM | POA: Diagnosis not present

## 2023-09-14 DIAGNOSIS — M25512 Pain in left shoulder: Secondary | ICD-10-CM | POA: Diagnosis not present

## 2023-09-21 DIAGNOSIS — E291 Testicular hypofunction: Secondary | ICD-10-CM | POA: Diagnosis not present

## 2023-09-22 DIAGNOSIS — M25511 Pain in right shoulder: Secondary | ICD-10-CM | POA: Diagnosis not present

## 2023-09-23 DIAGNOSIS — E291 Testicular hypofunction: Secondary | ICD-10-CM | POA: Diagnosis not present

## 2023-09-23 DIAGNOSIS — F324 Major depressive disorder, single episode, in partial remission: Secondary | ICD-10-CM | POA: Diagnosis not present

## 2023-09-23 DIAGNOSIS — I10 Essential (primary) hypertension: Secondary | ICD-10-CM | POA: Diagnosis not present

## 2023-09-23 DIAGNOSIS — R0602 Shortness of breath: Secondary | ICD-10-CM | POA: Diagnosis not present

## 2023-09-28 DIAGNOSIS — M25511 Pain in right shoulder: Secondary | ICD-10-CM | POA: Diagnosis not present

## 2023-10-04 DIAGNOSIS — K5730 Diverticulosis of large intestine without perforation or abscess without bleeding: Secondary | ICD-10-CM | POA: Diagnosis not present

## 2023-10-04 DIAGNOSIS — D120 Benign neoplasm of cecum: Secondary | ICD-10-CM | POA: Diagnosis not present

## 2023-10-04 DIAGNOSIS — Z1211 Encounter for screening for malignant neoplasm of colon: Secondary | ICD-10-CM | POA: Diagnosis not present

## 2023-10-04 DIAGNOSIS — D125 Benign neoplasm of sigmoid colon: Secondary | ICD-10-CM | POA: Diagnosis not present

## 2023-10-04 DIAGNOSIS — K649 Unspecified hemorrhoids: Secondary | ICD-10-CM | POA: Diagnosis not present

## 2023-10-06 DIAGNOSIS — D125 Benign neoplasm of sigmoid colon: Secondary | ICD-10-CM | POA: Diagnosis not present

## 2023-10-06 DIAGNOSIS — D120 Benign neoplasm of cecum: Secondary | ICD-10-CM | POA: Diagnosis not present

## 2023-10-11 DIAGNOSIS — M25511 Pain in right shoulder: Secondary | ICD-10-CM | POA: Diagnosis not present

## 2023-10-26 DIAGNOSIS — K219 Gastro-esophageal reflux disease without esophagitis: Secondary | ICD-10-CM | POA: Diagnosis not present

## 2023-10-26 DIAGNOSIS — I10 Essential (primary) hypertension: Secondary | ICD-10-CM | POA: Diagnosis not present

## 2023-10-26 DIAGNOSIS — E7800 Pure hypercholesterolemia, unspecified: Secondary | ICD-10-CM | POA: Diagnosis not present

## 2023-10-26 DIAGNOSIS — F324 Major depressive disorder, single episode, in partial remission: Secondary | ICD-10-CM | POA: Diagnosis not present

## 2023-10-26 DIAGNOSIS — E291 Testicular hypofunction: Secondary | ICD-10-CM | POA: Diagnosis not present

## 2023-10-26 DIAGNOSIS — F419 Anxiety disorder, unspecified: Secondary | ICD-10-CM | POA: Diagnosis not present

## 2023-10-26 DIAGNOSIS — K760 Fatty (change of) liver, not elsewhere classified: Secondary | ICD-10-CM | POA: Diagnosis not present

## 2023-10-26 DIAGNOSIS — J309 Allergic rhinitis, unspecified: Secondary | ICD-10-CM | POA: Diagnosis not present

## 2023-10-26 DIAGNOSIS — K2270 Barrett's esophagus without dysplasia: Secondary | ICD-10-CM | POA: Diagnosis not present

## 2023-11-25 DIAGNOSIS — E291 Testicular hypofunction: Secondary | ICD-10-CM | POA: Diagnosis not present

## 2023-12-26 DIAGNOSIS — E291 Testicular hypofunction: Secondary | ICD-10-CM | POA: Diagnosis not present

## 2024-01-05 DIAGNOSIS — J0100 Acute maxillary sinusitis, unspecified: Secondary | ICD-10-CM | POA: Diagnosis not present

## 2024-02-08 DIAGNOSIS — E291 Testicular hypofunction: Secondary | ICD-10-CM | POA: Diagnosis not present
